# Patient Record
Sex: Male | Born: 1970 | Race: White | Hispanic: No | Marital: Single
Health system: Southern US, Community
[De-identification: ages and names within clinical notes are randomized; demographics above are authoritative.]

## PROBLEM LIST (undated history)

## (undated) DIAGNOSIS — F10231 Alcohol dependence with withdrawal delirium: Principal | ICD-10-CM

## (undated) DIAGNOSIS — I10 Essential (primary) hypertension: Secondary | ICD-10-CM

## (undated) DIAGNOSIS — F419 Anxiety disorder, unspecified: Secondary | ICD-10-CM

## (undated) DIAGNOSIS — D696 Thrombocytopenia, unspecified: Secondary | ICD-10-CM

## (undated) DIAGNOSIS — G4733 Obstructive sleep apnea (adult) (pediatric): Secondary | ICD-10-CM

## (undated) DIAGNOSIS — F1010 Alcohol abuse, uncomplicated: Secondary | ICD-10-CM

## (undated) DIAGNOSIS — R740 Nonspecific elevation of levels of transaminase and lactic acid dehydrogenase [LDH]: Secondary | ICD-10-CM

---
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
## 2005-11-02 ENCOUNTER — Emergency Department (HOSPITAL_COMMUNITY): Admission: EM | Admit: 2005-11-02 | Discharge: 2005-11-02 | Payer: Self-pay | Admitting: Emergency Medicine

## 2008-03-26 IMAGING — CR DG KNEE COMPLETE 4+V*R*
4 series · 4 of 4 positions shown · non-contrast
Comparison: none

CLINICAL DATA: Fall, right knee trauma and pain.
 RIGHT KNEE - 4 VIEW:
 There is no evidence of fracture, dislocation or joint effusion.  There is no evidence of arthropathy or other focal bone abnormality.  Soft tissues are unremarkable.

[t knee oblique right (1 of 2)]
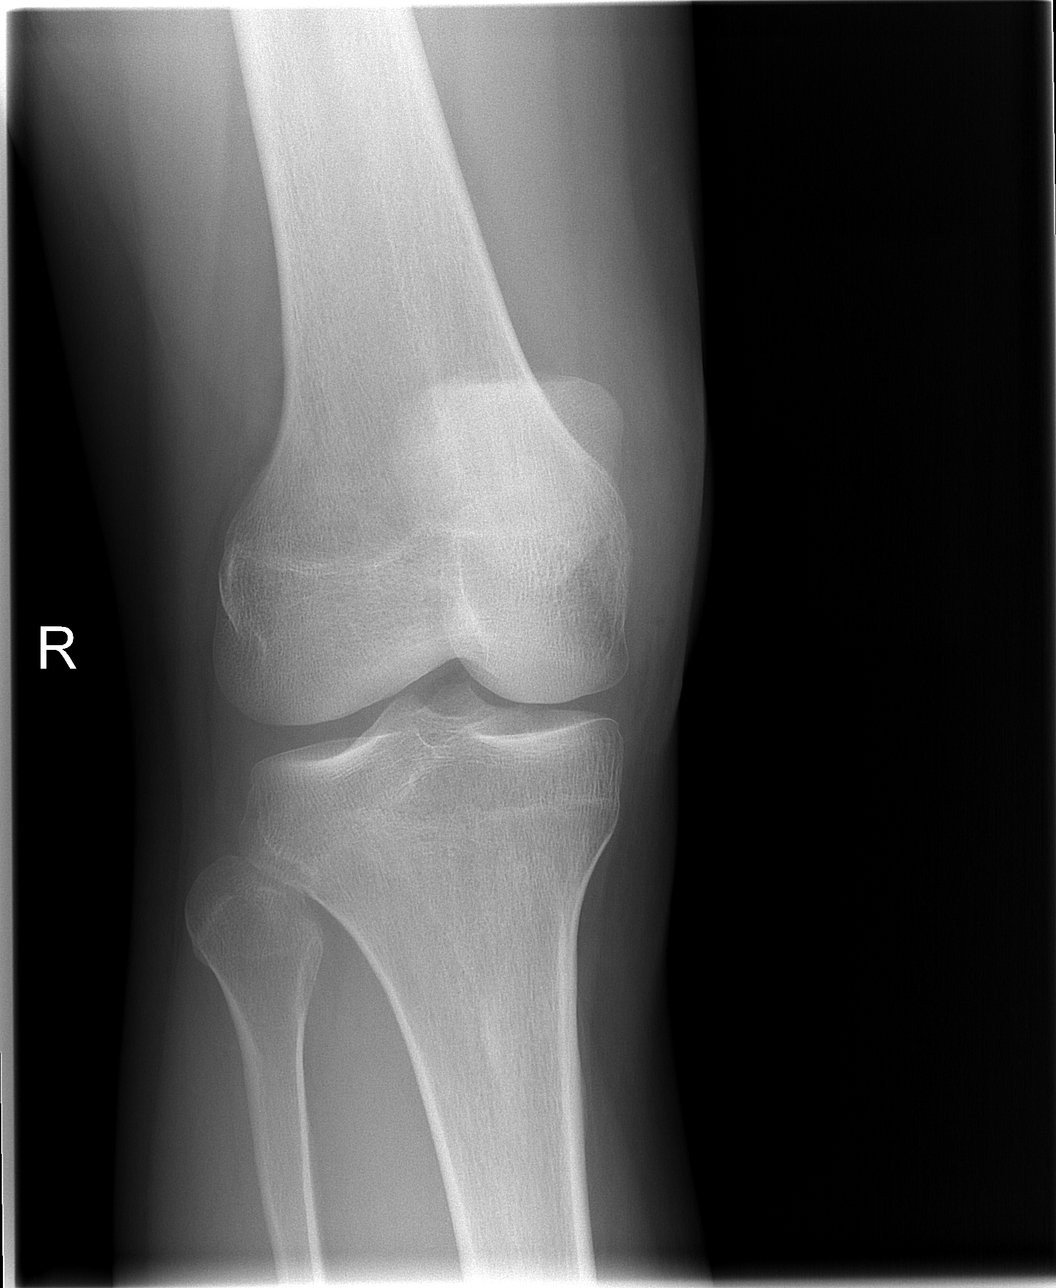

[t knee oblique right (2 of 2)]
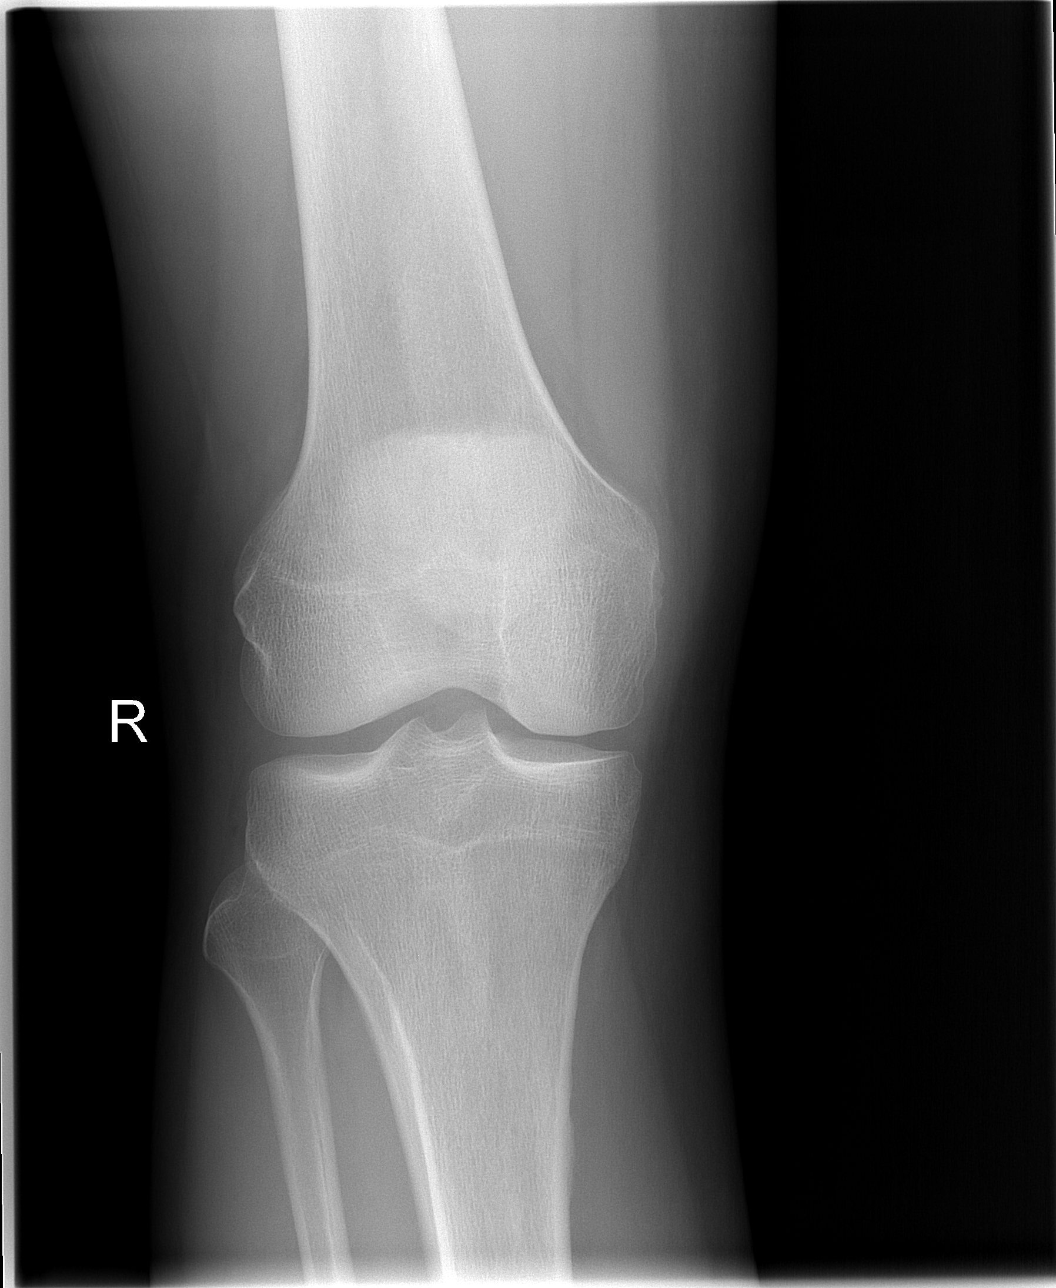

[t knee lat right]
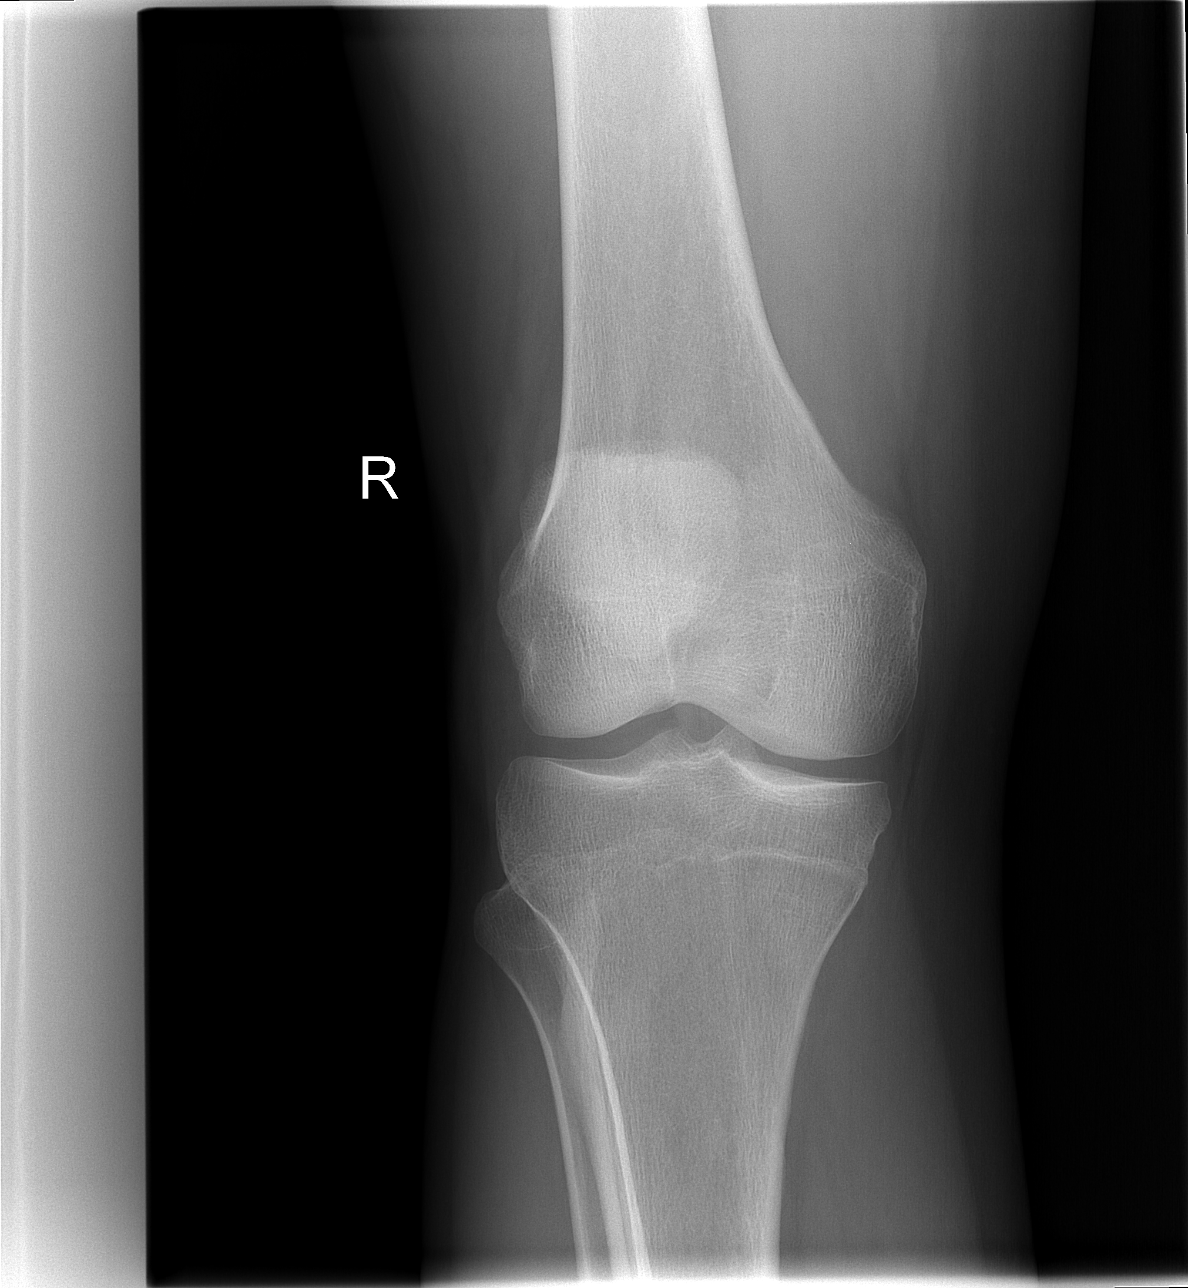

[t knee tunnel view right]
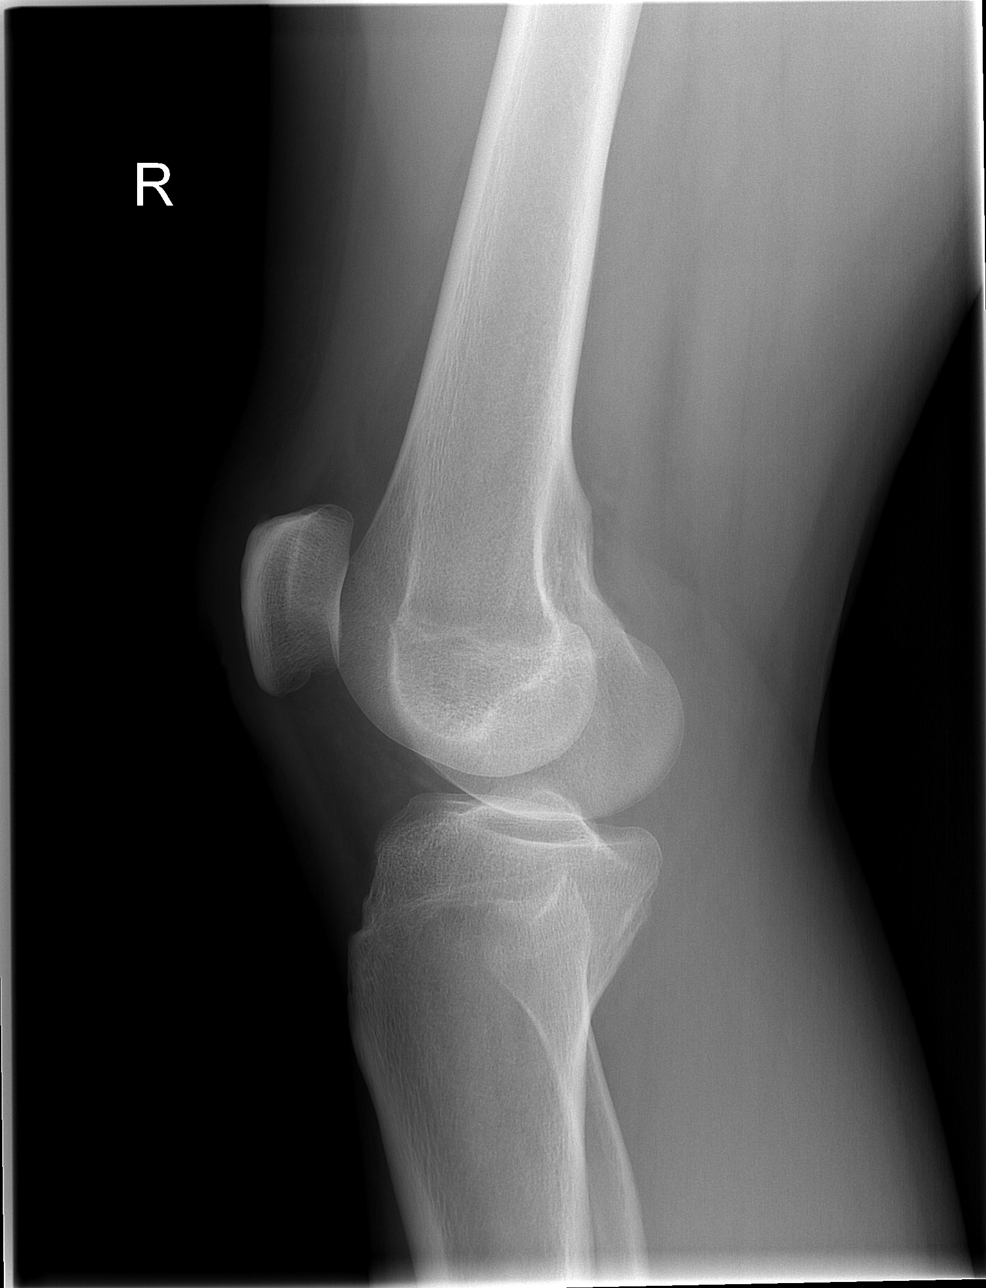

[4 of 4 positions shown; findings below may reference images not displayed]

IMPRESSION: Negative.

## 2011-06-12 ENCOUNTER — Encounter (HOSPITAL_COMMUNITY): Payer: Self-pay | Admitting: Emergency Medicine

## 2011-06-12 ENCOUNTER — Emergency Department (HOSPITAL_COMMUNITY): Payer: BC Managed Care – PPO

## 2011-06-12 ENCOUNTER — Inpatient Hospital Stay (HOSPITAL_COMMUNITY)
Admission: EM | Admit: 2011-06-12 | Discharge: 2011-06-16 | DRG: 750 | Disposition: A | Discharge status: Home / Self Care | Payer: BC Managed Care – PPO | Attending: Internal Medicine | Admitting: Internal Medicine

## 2011-06-12 DIAGNOSIS — R000 Tachycardia, unspecified: Secondary | ICD-10-CM | POA: Diagnosis present

## 2011-06-12 DIAGNOSIS — G4733 Obstructive sleep apnea (adult) (pediatric): Secondary | ICD-10-CM | POA: Diagnosis present

## 2011-06-12 DIAGNOSIS — R7401 Elevation of levels of liver transaminase levels: Secondary | ICD-10-CM | POA: Diagnosis present

## 2011-06-12 DIAGNOSIS — F1010 Alcohol abuse, uncomplicated: Secondary | ICD-10-CM | POA: Diagnosis present

## 2011-06-12 DIAGNOSIS — F411 Generalized anxiety disorder: Secondary | ICD-10-CM | POA: Diagnosis present

## 2011-06-12 DIAGNOSIS — H5316 Psychophysical visual disturbances: Secondary | ICD-10-CM | POA: Diagnosis present

## 2011-06-12 DIAGNOSIS — G9349 Other encephalopathy: Secondary | ICD-10-CM | POA: Diagnosis present

## 2011-06-12 DIAGNOSIS — F10931 Alcohol use, unspecified with withdrawal delirium: Principal | ICD-10-CM | POA: Diagnosis present

## 2011-06-12 DIAGNOSIS — E872 Acidosis, unspecified: Secondary | ICD-10-CM | POA: Diagnosis present

## 2011-06-12 DIAGNOSIS — F10231 Alcohol dependence with withdrawal delirium: Principal | ICD-10-CM

## 2011-06-12 DIAGNOSIS — J9600 Acute respiratory failure, unspecified whether with hypoxia or hypercapnia: Secondary | ICD-10-CM | POA: Diagnosis not present

## 2011-06-12 DIAGNOSIS — G9340 Encephalopathy, unspecified: Secondary | ICD-10-CM | POA: Diagnosis present

## 2011-06-12 DIAGNOSIS — R410 Disorientation, unspecified: Secondary | ICD-10-CM

## 2011-06-12 DIAGNOSIS — R4182 Altered mental status, unspecified: Secondary | ICD-10-CM | POA: Diagnosis not present

## 2011-06-12 DIAGNOSIS — R7402 Elevation of levels of lactic acid dehydrogenase (LDH): Secondary | ICD-10-CM | POA: Diagnosis present

## 2011-06-12 DIAGNOSIS — F419 Anxiety disorder, unspecified: Secondary | ICD-10-CM | POA: Diagnosis present

## 2011-06-12 DIAGNOSIS — D696 Thrombocytopenia, unspecified: Secondary | ICD-10-CM | POA: Diagnosis present

## 2011-06-12 DIAGNOSIS — E860 Dehydration: Secondary | ICD-10-CM | POA: Diagnosis present

## 2011-06-12 DIAGNOSIS — E876 Hypokalemia: Secondary | ICD-10-CM | POA: Diagnosis present

## 2011-06-12 DIAGNOSIS — I10 Essential (primary) hypertension: Secondary | ICD-10-CM | POA: Diagnosis present

## 2011-06-12 DIAGNOSIS — J9811 Atelectasis: Secondary | ICD-10-CM | POA: Diagnosis not present

## 2011-06-12 HISTORY — DX: Nonspecific elevation of levels of transaminase and lactic acid dehydrogenase (ldh): R74.0

## 2011-06-12 HISTORY — DX: Thrombocytopenia, unspecified: D69.6

## 2011-06-12 HISTORY — DX: Alcohol dependence with withdrawal delirium: F10.231

## 2011-06-12 HISTORY — DX: Alcohol abuse, uncomplicated: F10.10

## 2011-06-12 HISTORY — DX: Obstructive sleep apnea (adult) (pediatric): G47.33

## 2011-06-12 HISTORY — DX: Essential (primary) hypertension: I10

## 2011-06-12 HISTORY — DX: Anxiety disorder, unspecified: F41.9

## 2011-06-12 LAB — POCT I-STAT, CHEM 8
BUN: 40 mg/dL — ABNORMAL HIGH (ref 6–23)
Chloride: 104 mEq/L (ref 96–112)
Creatinine, Ser: 1.4 mg/dL — ABNORMAL HIGH (ref 0.50–1.35)
Potassium: 3.8 mEq/L (ref 3.5–5.1)
Sodium: 135 mEq/L (ref 135–145)

## 2011-06-12 LAB — ETHANOL: Alcohol, Ethyl (B): <11 mg/dL (ref 0–11)

## 2011-06-12 LAB — RAPID URINE DRUG SCREEN, HOSP PERFORMED: Barbiturates: NOT DETECTED

## 2011-06-12 MED ORDER — ACETAMINOPHEN 325 MG PO TABS
650.0000 mg | ORAL_TABLET | ORAL | Status: DC | PRN
  Filled 2011-06-12: qty 1

## 2011-06-12 MED ORDER — LORAZEPAM 1 MG PO TABS
1.0000 mg | ORAL_TABLET | Freq: Once | ORAL | Status: AC
  Administered 2011-06-12: 1 mg via ORAL
  Filled 2011-06-12: qty 1

## 2011-06-12 MED ORDER — NICOTINE 21 MG/24HR TD PT24
21.0000 mg | MEDICATED_PATCH | Freq: Once | TRANSDERMAL | Status: AC
  Administered 2011-06-12: 21 mg via TRANSDERMAL
  Filled 2011-06-12: qty 1

## 2011-06-12 MED ORDER — LORAZEPAM 2 MG/ML IJ SOLN: 2.0000 mg | Freq: Once | INTRAMUSCULAR | Status: DC

## 2011-06-12 MED ORDER — SODIUM CHLORIDE 0.9 % IV BOLUS (SEPSIS): 500.0000 mL | Freq: Once | INTRAVENOUS | Status: DC

## 2011-06-12 MED ORDER — LORAZEPAM 2 MG/ML IJ SOLN
1.0000 mg | Freq: Once | INTRAMUSCULAR | Status: DC
  Filled 2011-06-12: qty 1

## 2011-06-12 MED ORDER — ZIPRASIDONE MESYLATE 20 MG IM SOLR
10.0000 mg | Freq: Once | INTRAMUSCULAR | Status: AC
  Administered 2011-06-12: 10 mg via INTRAMUSCULAR
  Filled 2011-06-12: qty 20

## 2011-06-12 MED ORDER — ALUM & MAG HYDROXIDE-SIMETH 200-200-20 MG/5ML PO SUSP: 30.0000 mL | ORAL | Status: DC | PRN

## 2011-06-12 MED ORDER — ONDANSETRON HCL 4 MG PO TABS: 4.0000 mg | ORAL_TABLET | Freq: Three times a day (TID) | ORAL | Status: DC | PRN

## 2011-06-12 NOTE — ED Notes (Signed)
Pt presenting to ed with c/o possible medication reaction pt states he's been on medication for 3 days pt states medication is affecting his professional and personal life. Pt states he was unable to see pcp.

## 2011-06-12 NOTE — ED Provider Notes (Signed)
History     CSN: [id number]  Arrival date & time 06/12/11  1255   First MD Initiated Contact with Patient 06/12/11 1437      Chief Complaint  Patient presents with  . Medication Reaction    (Consider location/radiation/quality/duration/timing/severity/associated sxs/prior treatment) The history is provided by the patient and a parent.  41 y/o M with PMH of anxiety, HTN, and admitted alcohol abuse with at least 4 shots of liquor/4 beers each day (until 1.5 weeks ago, when he reports he stopped drinking) presents to ED with c/c of medication reaction, anxiety, and anger issues. Was seen by his PCP, Dr Larwance Sachs 2 days ago for anxiety and anger issues with new rx started of propanolol and clonazepam. Labs to include CBC, CMP, thyroid studies were also drawn but pt does not known the results. Pt reports that after starting the medications, he began to have visual hallucinations, especially at night while trying to sleep. He describes difficulty sleeping for several months but denies he has ever had associated hallucinations in the past. Denies any SI or HI. Denies any recreational substance use. Per mother, called PCP today to discuss hallucinations and was advised to present to ED for evaluation. Pt denies any hallucinations today; has not taken the propanolol or clonazepam today. He requests a change in medication, "something to help with me getting so anxious and angry all the time", as he feels his behavior is affecting multiple aspects of his life. Denies any pain.  Past Medical History  Diagnosis Date  . Anxiety   . Hypertension     History reviewed. No pertinent past surgical history.  No family history on file.  History  Substance Use Topics  . Smoking status: Current Everyday Smoker  . Smokeless tobacco: Not on file  . Alcohol Use: 1.8 oz/week    3 Cans of beer per week     daily      Review of Systems  Constitutional: Negative for fever and chills.  HENT: Negative for ear  pain, sore throat and neck pain.   Eyes: Positive for visual disturbance. Negative for pain.  Respiratory: Negative for cough and shortness of breath.   Cardiovascular: Negative for chest pain.  Gastrointestinal: Negative for nausea, vomiting and abdominal pain.  Genitourinary: Negative for dysuria and hematuria.  Musculoskeletal: Negative for back pain and gait problem.  Skin: Negative for rash and wound.  Neurological: Positive for tremors. Negative for dizziness, seizures, speech difficulty, weakness and headaches.  Psychiatric/Behavioral:       See HPI, otherwise negative    Allergies  Review of patient's allergies indicates no known allergies.  Home Medications   Current Outpatient Rx  Name Route Sig Dispense Refill  . CLONAZEPAM 0.5 MG PO TABS Oral Take 0.5 mg by mouth 2 (two) times daily as needed. ANXIETY    . PROPRANOLOL HCL 20 MG PO TABS Oral Take 20 mg by mouth 2 (two) times daily.      BP 118/75  Pulse 109  Resp 20  SpO2 97%  Physical Exam  Nursing note and vitals reviewed. Constitutional: He is oriented to person, place, and time. He appears well-developed and well-nourished.       Anxious appearing, in no distress  HENT:  Head: Normocephalic and atraumatic.  Right Ear: External ear normal.  Left Ear: External ear normal.  Eyes: Conjunctivae are normal. Pupils are equal, round, and reactive to light. No scleral icterus.  Neck: Normal range of motion. Neck supple.  Cardiovascular: Regular rhythm,  normal heart sounds and normal pulses.  Tachycardia present.   Pulmonary/Chest: Effort normal and breath sounds normal. No respiratory distress. He exhibits no tenderness.  Abdominal: Soft. He exhibits no distension. There is no tenderness.  Musculoskeletal: He exhibits no edema and no tenderness.  Neurological: He is alert and oriented to person, place, and time. He has normal strength. He displays tremor. No cranial nerve deficit. He displays no seizure activity. Gait  normal. GCS eye subscore is 4. GCS verbal subscore is 5. GCS motor subscore is 6.  Skin: Skin is warm and dry.  Psychiatric: His mood appears anxious. His speech is rapid and/or pressured. He is agitated. He is not actively hallucinating. Thought content is not paranoid and not delusional. He expresses impulsivity. He expresses no homicidal and no suicidal ideation.    ED Course  Procedures (including critical care time)  Labs Reviewed  POCT I-STAT, CHEM 8 - Abnormal; Notable for the following:    BUN 40 (*)    Creatinine, Ser 1.40 (*)    Hemoglobin 17.3 (*)    All other components within normal limits  ETHANOL  URINE RAPID DRUG SCREEN (HOSP PERFORMED)    MDM  Pt requesting medication change. Recent alcohol abuse, though reported timeframe of cessation puts pt out of window for necessary medical detox. However, his mother is concerned he is not truthful regarding his alcohol intake and feels he may have been drinking more recently. Basic labs are ordered. Does not appear to be actively hallucinating. Per discussion with attending MD, tele-psych consult is ordered to eval pt medications and for recommendations going forward. Will also plan to refer patient to outpatient psychiatrist for medication management, should tele-psych recommend discharge.  Report has been given to [facility], who will follow-up with recommendations. Psych holding orders will be placed.       Shaaron Adler, [place] 06/12/11 1549

## 2011-06-12 NOTE — ED Provider Notes (Signed)
Medical screening examination/treatment/procedure(s) were performed by non-physician practitioner and as supervising physician I was immediately available for consultation/collaboration.   Lyanne Co, MD 06/12/11 [phone]

## 2011-06-12 NOTE — ED Notes (Signed)
Telepsych called and states MD will be calling soon for consult

## 2011-06-12 NOTE — ED Notes (Signed)
Mother to desk stating pt is an alcoholic and is concerned this may have something to do with this

## 2011-06-12 NOTE — ED Notes (Signed)
Pt states he recently started anxiety and blood pressure meds

## 2011-06-12 NOTE — ED Notes (Signed)
Pt in cuffs [employer] and security at bedside for safety. Pt stated, "how many 7year olds can I fuck". Pt also stated," I need to get to my star ship"

## 2011-06-12 NOTE — ED Provider Notes (Cosign Needed Addendum)
Signed out by Dr Patria Mane that telepsych evaluation pending. While in ED pt w periods restless, agitation, hallucinations. Pt was given ativan, provided reassurance.  Calmer.  Return of agitation, ?delusional.  geodon im.  telepsych still pending.  No sweats, no tremor or shakes. Vitals noted. Will recheck.    Psychiatry consult - telepsych md expresses concern re organic delirium possibly the result of etoh withdrawal. ?masking vitals/symptoms due to recent rx for propranolol.  pts med/psych hx unclear, also unclear when last etoh use. As persistent delirium, possible etoh withdrawal, will admit to med service.   etoh withdrawal/ativan ciwa protocol initiated.     Date: 06/13/2011  Rate: 110  Rhythm: sinus tachycardia  QRS Axis: normal  Intervals: normal  ST/T Wave abnormalities: nonspecific ST/T changes  Conduction Disutrbances:none  Narrative Interpretation:   Old EKG Reviewed: none available    Suzi Roots, MD 06/12/11 2138  Suzi Roots, MD 06/12/11 4098  Suzi Roots, MD 06/13/11 0003

## 2011-06-13 ENCOUNTER — Encounter (HOSPITAL_COMMUNITY): Payer: Self-pay | Admitting: Internal Medicine

## 2011-06-13 DIAGNOSIS — J9811 Atelectasis: Secondary | ICD-10-CM | POA: Diagnosis not present

## 2011-06-13 DIAGNOSIS — F10931 Alcohol use, unspecified with withdrawal delirium: Principal | ICD-10-CM

## 2011-06-13 DIAGNOSIS — R4182 Altered mental status, unspecified: Secondary | ICD-10-CM | POA: Diagnosis not present

## 2011-06-13 DIAGNOSIS — F10231 Alcohol dependence with withdrawal delirium: Principal | ICD-10-CM

## 2011-06-13 DIAGNOSIS — F419 Anxiety disorder, unspecified: Secondary | ICD-10-CM | POA: Diagnosis present

## 2011-06-13 DIAGNOSIS — R000 Tachycardia, unspecified: Secondary | ICD-10-CM | POA: Diagnosis present

## 2011-06-13 DIAGNOSIS — J9819 Other pulmonary collapse: Secondary | ICD-10-CM

## 2011-06-13 DIAGNOSIS — I10 Essential (primary) hypertension: Secondary | ICD-10-CM

## 2011-06-13 DIAGNOSIS — D696 Thrombocytopenia, unspecified: Secondary | ICD-10-CM

## 2011-06-13 DIAGNOSIS — E876 Hypokalemia: Secondary | ICD-10-CM | POA: Diagnosis present

## 2011-06-13 DIAGNOSIS — G4733 Obstructive sleep apnea (adult) (pediatric): Secondary | ICD-10-CM

## 2011-06-13 DIAGNOSIS — E872 Acidosis: Secondary | ICD-10-CM | POA: Diagnosis present

## 2011-06-13 DIAGNOSIS — F1010 Alcohol abuse, uncomplicated: Secondary | ICD-10-CM

## 2011-06-13 DIAGNOSIS — E860 Dehydration: Secondary | ICD-10-CM | POA: Diagnosis present

## 2011-06-13 DIAGNOSIS — G9340 Encephalopathy, unspecified: Secondary | ICD-10-CM | POA: Diagnosis present

## 2011-06-13 DIAGNOSIS — J9600 Acute respiratory failure, unspecified whether with hypoxia or hypercapnia: Secondary | ICD-10-CM

## 2011-06-13 HISTORY — DX: Alcohol abuse, uncomplicated: F10.10

## 2011-06-13 HISTORY — DX: Obstructive sleep apnea (adult) (pediatric): G47.33

## 2011-06-13 HISTORY — DX: Alcohol dependence with withdrawal delirium: F10.231

## 2011-06-13 HISTORY — DX: Essential (primary) hypertension: I10

## 2011-06-13 HISTORY — DX: Alcohol use, unspecified with withdrawal delirium: F10.931

## 2011-06-13 HISTORY — DX: Thrombocytopenia, unspecified: D69.6

## 2011-06-13 LAB — HEPATIC FUNCTION PANEL
AST: 68 U/L — ABNORMAL HIGH (ref 0–37)
Bilirubin, Direct: 0.2 mg/dL (ref 0.0–0.3)
Indirect Bilirubin: 0.7 mg/dL (ref 0.3–0.9)
Total Protein: 6.4 g/dL (ref 6.0–8.3)

## 2011-06-13 LAB — BASIC METABOLIC PANEL
BUN: 32 mg/dL — ABNORMAL HIGH (ref 6–23)
GFR calc Af Amer: >90 mL/min (ref >90)
GFR calc non Af Amer: >90 mL/min (ref >90)
Potassium: 3.1 mEq/L — ABNORMAL LOW (ref 3.5–5.1)

## 2011-06-13 LAB — CBC
Hemoglobin: 13.5 g/dL (ref 13.0–17.0)
MCHC: 34.8 g/dL (ref 30.0–36.0)
RDW: 15 % (ref 11.5–15.5)
WBC: 8.3 10*3/uL (ref 4.0–10.5)

## 2011-06-13 LAB — MAGNESIUM: Magnesium: 1.6 mg/dL (ref 1.5–2.5)

## 2011-06-13 LAB — MRSA PCR SCREENING: MRSA by PCR: NEGATIVE

## 2011-06-13 LAB — HAPTOGLOBIN: Haptoglobin: 91 mg/dL (ref 45–215)

## 2011-06-13 MED ORDER — ENOXAPARIN SODIUM 40 MG/0.4ML ~~LOC~~ SOLN
40.0000 mg | SUBCUTANEOUS | Status: DC
  Filled 2011-06-13: qty 0.4

## 2011-06-13 MED ORDER — ALUM & MAG HYDROXIDE-SIMETH 200-200-20 MG/5ML PO SUSP: 30.0000 mL | Freq: Four times a day (QID) | ORAL | Status: DC | PRN

## 2011-06-13 MED ORDER — LORAZEPAM 2 MG/ML IJ SOLN: 0.0000 mg | Freq: Four times a day (QID) | INTRAMUSCULAR | Status: DC

## 2011-06-13 MED ORDER — HYDROMORPHONE HCL PF 1 MG/ML IJ SOLN: 0.5000 mg | INTRAMUSCULAR | Status: DC | PRN

## 2011-06-13 MED ORDER — LORAZEPAM 2 MG/ML IJ SOLN
1.0000 mg | Freq: Four times a day (QID) | INTRAMUSCULAR | Status: AC | PRN
  Administered 2011-06-13: 1 mg via INTRAVENOUS
  Filled 2011-06-13 (×5): qty 1

## 2011-06-13 MED ORDER — HALOPERIDOL LACTATE 5 MG/ML IJ SOLN
INTRAMUSCULAR | Status: AC
  Filled 2011-06-13: qty 1

## 2011-06-13 MED ORDER — LORAZEPAM 2 MG/ML IJ SOLN
0.0000 mg | Freq: Two times a day (BID) | INTRAMUSCULAR | Status: DC
  Administered 2011-06-15: 2 mg via INTRAVENOUS
  Administered 2011-06-16: 1 mg via INTRAVENOUS
  Filled 2011-06-13: qty 2

## 2011-06-13 MED ORDER — ACETAMINOPHEN 650 MG RE SUPP: 650.0000 mg | Freq: Four times a day (QID) | RECTAL | Status: DC | PRN

## 2011-06-13 MED ORDER — HALOPERIDOL LACTATE 5 MG/ML IJ SOLN
5.0000 mg | Freq: Four times a day (QID) | INTRAMUSCULAR | Status: DC | PRN
  Administered 2011-06-13: 5 mg via INTRAVENOUS

## 2011-06-13 MED ORDER — LORAZEPAM 2 MG/ML IJ SOLN: 0.0000 mg | Freq: Two times a day (BID) | INTRAMUSCULAR | Status: DC

## 2011-06-13 MED ORDER — ADULT MULTIVITAMIN W/MINERALS CH
1.0000 | ORAL_TABLET | Freq: Every day | ORAL | Status: DC
  Filled 2011-06-13: qty 1

## 2011-06-13 MED ORDER — LORAZEPAM 2 MG/ML IJ SOLN: 1.0000 mg | Freq: Four times a day (QID) | INTRAMUSCULAR | Status: DC | PRN

## 2011-06-13 MED ORDER — SODIUM CHLORIDE 0.9 % IV SOLN
0.4000 ug/kg/h | INTRAVENOUS | Status: DC
  Administered 2011-06-13: 0.4 ug/kg/h via INTRAVENOUS
  Filled 2011-06-13: qty 2

## 2011-06-13 MED ORDER — ZOLPIDEM TARTRATE 5 MG PO TABS: 5.0000 mg | ORAL_TABLET | Freq: Every evening | ORAL | Status: DC | PRN

## 2011-06-13 MED ORDER — THIAMINE HCL 100 MG/ML IJ SOLN: 100.0000 mg | Freq: Every day | INTRAMUSCULAR | Status: DC

## 2011-06-13 MED ORDER — SODIUM CHLORIDE 0.9 % IV BOLUS (SEPSIS)
1000.0000 mL | Freq: Once | INTRAVENOUS | Status: AC
  Administered 2011-06-13: 1000 mL via INTRAVENOUS

## 2011-06-13 MED ORDER — THIAMINE HCL 100 MG/ML IJ SOLN
100.0000 mg | Freq: Every day | INTRAMUSCULAR | Status: DC
  Administered 2011-06-13 – 2011-06-15 (×2): 100 mg via INTRAVENOUS
  Filled 2011-06-13 (×4): qty 1

## 2011-06-13 MED ORDER — ACETAMINOPHEN 325 MG PO TABS: 650.0000 mg | ORAL_TABLET | Freq: Four times a day (QID) | ORAL | Status: DC | PRN

## 2011-06-13 MED ORDER — POTASSIUM CHLORIDE CRYS ER 20 MEQ PO TBCR: 40.0000 meq | EXTENDED_RELEASE_TABLET | Freq: Once | ORAL | Status: DC

## 2011-06-13 MED ORDER — VITAMIN B-1 100 MG PO TABS
100.0000 mg | ORAL_TABLET | Freq: Every day | ORAL | Status: DC
  Administered 2011-06-14 – 2011-06-16 (×2): 100 mg via ORAL
  Filled 2011-06-13 (×4): qty 1

## 2011-06-13 MED ORDER — FOLIC ACID 1 MG PO TABS: 1.0000 mg | ORAL_TABLET | Freq: Every day | ORAL | Status: DC

## 2011-06-13 MED ORDER — LORAZEPAM 1 MG PO TABS: 1.0000 mg | ORAL_TABLET | Freq: Four times a day (QID) | ORAL | Status: DC | PRN

## 2011-06-13 MED ORDER — LORAZEPAM 2 MG/ML IJ SOLN
2.0000 mg | Freq: Once | INTRAMUSCULAR | Status: AC
  Administered 2011-06-13: 2 mg via INTRAMUSCULAR

## 2011-06-13 MED ORDER — ONDANSETRON HCL 4 MG/2ML IJ SOLN: 4.0000 mg | Freq: Four times a day (QID) | INTRAMUSCULAR | Status: DC | PRN

## 2011-06-13 MED ORDER — LORAZEPAM 1 MG PO TABS
1.0000 mg | ORAL_TABLET | Freq: Four times a day (QID) | ORAL | Status: AC | PRN
  Administered 2011-06-15: 1 mg via ORAL
  Filled 2011-06-13: qty 1

## 2011-06-13 MED ORDER — OXYCODONE HCL 5 MG PO TABS: 5.0000 mg | ORAL_TABLET | ORAL | Status: DC | PRN

## 2011-06-13 MED ORDER — PANTOPRAZOLE SODIUM 40 MG IV SOLR
40.0000 mg | INTRAVENOUS | Status: DC
  Administered 2011-06-13: 40 mg via INTRAVENOUS
  Filled 2011-06-13 (×2): qty 40

## 2011-06-13 MED ORDER — POTASSIUM CHLORIDE 10 MEQ/100ML IV SOLN
10.0000 meq | INTRAVENOUS | Status: AC
  Administered 2011-06-13 (×4): 10 meq via INTRAVENOUS
  Filled 2011-06-13: qty 100
  Filled 2011-06-13: qty 200
  Filled 2011-06-13: qty 100

## 2011-06-13 MED ORDER — ONDANSETRON HCL 4 MG PO TABS: 4.0000 mg | ORAL_TABLET | Freq: Four times a day (QID) | ORAL | Status: DC | PRN

## 2011-06-13 MED ORDER — STERILE WATER FOR INJECTION IV SOLN
INTRAVENOUS | Status: DC
  Administered 2011-06-13 – 2011-06-14 (×3): via INTRAVENOUS
  Filled 2011-06-13 (×6): qty 850

## 2011-06-13 MED ORDER — FOLIC ACID 1 MG PO TABS
1.0000 mg | ORAL_TABLET | Freq: Every day | ORAL | Status: DC
  Filled 2011-06-13: qty 1

## 2011-06-13 MED ORDER — THIAMINE HCL 100 MG/ML IJ SOLN
Freq: Once | INTRAVENOUS | Status: AC
  Administered 2011-06-13: 03:00:00 via INTRAVENOUS
  Filled 2011-06-13: qty 1000

## 2011-06-13 MED ORDER — LORAZEPAM 2 MG/ML IJ SOLN
0.0000 mg | Freq: Four times a day (QID) | INTRAMUSCULAR | Status: AC
  Administered 2011-06-13: 1 mg via INTRAVENOUS
  Administered 2011-06-13: 4 mg via INTRAVENOUS
  Administered 2011-06-14 (×4): 1 mg via INTRAVENOUS
  Filled 2011-06-13 (×4): qty 1

## 2011-06-13 MED ORDER — FOLIC ACID 5 MG/ML IJ SOLN
1.0000 mg | Freq: Every day | INTRAMUSCULAR | Status: DC
  Administered 2011-06-13 – 2011-06-15 (×3): 1 mg via INTRAVENOUS
  Filled 2011-06-13 (×4): qty 0.2

## 2011-06-13 MED ORDER — MAGNESIUM SULFATE 50 % IJ SOLN
3.0000 g | Freq: Once | INTRAVENOUS | Status: AC
  Administered 2011-06-13: 3 g via INTRAVENOUS
  Filled 2011-06-13: qty 6

## 2011-06-13 MED ORDER — SODIUM CHLORIDE 0.9 % IV SOLN
0.4000 ug/kg/h | INTRAVENOUS | Status: DC
  Administered 2011-06-13: 1 ug/kg/h via INTRAVENOUS
  Administered 2011-06-13: 0.9 ug/kg/h via INTRAVENOUS
  Administered 2011-06-13: 1 ug/kg/h via INTRAVENOUS
  Administered 2011-06-13: 0.8 ug/kg/h via INTRAVENOUS
  Administered 2011-06-14: 0.2 ug/kg/h via INTRAVENOUS
  Filled 2011-06-13 (×6): qty 4

## 2011-06-13 MED ORDER — VITAMIN B-1 100 MG PO TABS: 100.0000 mg | ORAL_TABLET | Freq: Every day | ORAL | Status: DC

## 2011-06-13 MED ORDER — ADULT MULTIVITAMIN W/MINERALS CH: 1.0000 | ORAL_TABLET | Freq: Every day | ORAL | Status: DC

## 2011-06-13 NOTE — Progress Notes (Signed)
Patient wakes up intermittently with restlessness and agitation. Attempts to pull his lines and tubes and actually pulled out his nasal trumpet from his left nare. He says " Get me out of this fucking place. I need a lawyer." He asked if something was wrong with his Dad. Patient's parents visiting at this time. They say he has been hallucinating. Continuing to reorient patient and give him all care and support as needed.

## 2011-06-13 NOTE — ED Notes (Addendum)
Pt is uncooperative and combative. Pt stated  about CNA,"  Don't leave me with her, I can tell she is tight by the EKG. She will ride me if you leave her here. I need recuperating time." Pt proceeded to urinate on floor after being offered a urinal. Pt is refusing to have a IV.

## 2011-06-13 NOTE — Consult Note (Signed)
Patient name: Warren Aguilar Medical record number: [id number] Date of birth: [birth year] Age: 41 y.o. Gender: male PCP: Berenda Morale, MD, MD  PCCM CONSULT NOTE  Date: 06/13/2011 Reason for Consult: Alcohol withdrawal, severe agitation Referring Physician: Dr. Lovell Sheehan  History of Present Illness: Warren Aguilar is a 41 year old gentleman with a past medical history significant for self-reported alcohol abuse who presented to the emergency department today with chief complaint of hallucinations and agitation. He reports he stopped drinking recently. His primary care had put him on propranolol and Klonopin recently. Prior this he was drinking at least 4 alcoholic beverages a day. He was admitted and started on CIWA protocol. Despite this he has been increasingly agitated. We are consulted for further evaluation of his agitation and the need for ICU care.  Lines/tubes :   Microbiology/Sepsis markers: No results found for this or any previous visit.  Anti-infectives:  Anti-infectives    None      Protocols:  Precedex  Consults: Treatment Team:  Md Pccm, MD    Studies: Ct Head Wo Contrast  06/12/2011  *RADIOLOGY REPORT*  Clinical Data: 41 year old male with hallucinations and visual disturbance  CT HEAD WITHOUT CONTRAST  Technique:  Contiguous axial images were obtained from the base of the skull through the vertex without contrast.  Comparison: None  Findings: No intracranial abnormalities are identified, including mass lesion or mass effect, hydrocephalus, extra-axial fluid collection, midline shift, hemorrhage, or acute infarction.  The visualized bony calvarium is unremarkable.  IMPRESSION: Unremarkable noncontrast head CT  Original Report Authenticated By: Rosendo Gros, M.D.     Events:   Past Medical History  Diagnosis Date  . Anxiety   . Hypertension     History reviewed. No pertinent past surgical history.  No family history on file.  Social History:  reports that he has  been smoking.  He does not have any smokeless tobacco history on file. He reports that he drinks about 1.8 ounces of alcohol per week. He reports that he does not use illicit drugs.  Allergies: No Known Allergies  Medications:  Prior to Admission medications   Medication Sig Start Date End Date Taking? Authorizing Provider  clonazePAM (KLONOPIN) 0.5 MG tablet Take 0.5 mg by mouth 2 (two) times daily as needed. ANXIETY   Yes Historical Provider, MD  propranolol (INDERAL) 20 MG tablet Take 20 mg by mouth 2 (two) times daily.   Yes Historical Provider, MD    Review of systems not obtained due to patient factors.  Vital signs for last 24 hours: Temp:  [97.4 F (36.3 C)-99 F (37.2 C)] 99 F (37.2 C) (04/13 0038) Pulse Rate:  [92-139] 99  (04/13 0230) Resp:  [16-36] 19  (04/13 0230) BP: (115-187)/(75-105) 151/84 mmHg (04/13 0230) SpO2:  [93 %-97 %] 94 % (04/13 0230) Weight:  [88.6 kg (195 lb 5.2 oz)] 88.6 kg (195 lb 5.2 oz) (04/13 0152)  Intake/Output this shift: Total I/O In: 1000 [IV Piggyback:1000] Out: -   Vent settings for last 24 hours:    Physical Exam:  General: Agitated, not alert or interactive. Eyes: Anicteric sclerae. ENT: Oropharynx clear. Moist mucous membranes. No thrush Lymph: No cervical, supraclavicular, or axillary lymphadenopathy. Heart: Normal S1, S2. No murmurs, rubs, or gallops appreciated. No bruits, equal pulses. Lungs: Breath sounds are equal, however definitely obstructs in the supine position.. Abdomen: Abdomen soft, non-tender and not distended, normoactive bowel sounds. No hepatosplenomegaly or masses. Musculoskeletal: No clubbing or synovitis. Skin: No rashes or lesions Neuro: No focal neurologic  deficits.  LAB RESULT Results for orders placed during the hospital encounter of 06/12/11 (from the past 24 hour(s))  ETHANOL     Status: Normal   Collection Time   06/12/11  3:07 PM      Component Value Range   Alcohol, Ethyl (B) <11  0 - 11 (mg/dL)   POCT I-STAT, CHEM 8     Status: Abnormal   Collection Time   06/12/11  3:15 PM      Component Value Range   Sodium 135  135 - 145 (mEq/L)   Potassium 3.8  3.5 - 5.1 (mEq/L)   Chloride 104  96 - 112 (mEq/L)   BUN 40 (*) 6 - 23 (mg/dL)   Creatinine, Ser 1.61 (*) 0.50 - 1.35 (mg/dL)   Glucose, Bld 80  70 - 99 (mg/dL)   Calcium, Ion 0.96  0.45 - 1.32 (mmol/L)   TCO2 17  0 - 100 (mmol/L)   Hemoglobin 17.3 (*) 13.0 - 17.0 (g/dL)   HCT 40.9  81.1 - 91.4 (%)  URINE RAPID DRUG SCREEN (HOSP PERFORMED)     Status: Normal   Collection Time   06/12/11  3:46 PM      Component Value Range   Opiates NONE DETECTED  NONE DETECTED    Cocaine NONE DETECTED  NONE DETECTED    Benzodiazepines NONE DETECTED  NONE DETECTED    Amphetamines NONE DETECTED  NONE DETECTED    Tetrahydrocannabinol NONE DETECTED  NONE DETECTED    Barbiturates NONE DETECTED  NONE DETECTED       ASSESSMENT AND PLAN  RESPIRATORY No results found for this basename: PHART:5,PCO2:5,PCO2ART:5,PO2ART:5,HCO3:5,O2SAT:5 in the last 168 hours A:  Probable obstructive sleep apnea P:  I placed a nasal trumpet, and this resulted in improvement in his airway.  CARDIAC No results found for this basename: TROPONINI:5,PROBNP:5 in the last 168 hours A:  Tachycardia P:   This is most likely a symptom of his alcohol withdrawal. It did come down nicely with the Precedex.  NEUROLOGIC A:  Alcohol withdrawal, altered mental status P:  Head CT was negative, he will continue on Precedex and Haldol on a taper in order to regain consciousness.    LOS: 1 day   Best practices / Disposition: -->ICU status under [facility] -->Full Code -->Heparin for DVT Px -->Protonix for GI Px -->ventilator bundle -->diet -->family updated at bedside  Lavella Hammock, M.D. Pulmonary and Critical Care Medicine Call E-link with questions [phone]  06/13/2011

## 2011-06-13 NOTE — H&P (Signed)
DATE OF ADMISSION:  06/13/2011  PCP:    Berenda Morale, MD, MD   Chief Complaint:  Confusion, Hallucinations   HPI: Warren Aguilar is an 41 y.o. male with a history of alcohol abuse who was brought to the ED secondary to visual hallucinations, extreme agitation .  His parents report that he has not had a drink in 2 days.    The patient reports that he was taking ativan for  Withdrawal symptoms the past 2 days.    Past Medical History  Diagnosis Date  . Anxiety   . Hypertension   . Alcohol withdrawal delirium 06/13/2011  . Thrombocytopenia 06/13/2011  . HTN (hypertension) 06/13/2011  . Alcohol abuse 06/13/2011  . OSA (obstructive sleep apnea) 06/13/2011    probable    History reviewed. No pertinent past surgical history.  Medications:  HOME MEDS: Prior to Admission medications   Medication Sig Start Date End Date Taking? Authorizing Provider  clonazePAM (KLONOPIN) 0.5 MG tablet Take 0.5 mg by mouth 2 (two) times daily as needed. ANXIETY   Yes Historical Provider, MD  propranolol (INDERAL) 20 MG tablet Take 20 mg by mouth 2 (two) times daily.   Yes Historical Provider, MD    Allergies:  No Known Allergies  Social History:   reports that he has been smoking.  He does not have any smokeless tobacco history on file. He reports that he drinks about 1.8 ounces of alcohol per week. He reports that he does not use illicit drugs.  Family History: No family history on file.  Review of Systems:  The patient denies anorexia, fever, weight loss,, vision loss, decreased hearing, hoarseness, chest pain, syncope, dyspnea on exertion, peripheral edema, balance deficits, hemoptysis, abdominal pain, melena, hematochezia, severe indigestion/heartburn, hematuria, incontinence, genital sores, muscle weakness, suspicious skin lesions, transient blindness, difficulty walking, depression, unusual weight change, abnormal bleeding, enlarged lymph nodes, angioedema, and breast masses.   Physical  Exam:  GEN:  Agitated and Anxious 41 year old Caucasian male examined  and in Mild distress; uncooperative with exam Filed Vitals:   06/13/11 0500 06/13/11 0600 06/13/11 0700 06/13/11 0800  BP: 111/69 114/75 114/69 125/81  Pulse: 80 77 73 80  Temp:      TempSrc:      Resp: 19 20 15 17   Height:      Weight:      SpO2: 94% 94% 93% 95%   Blood pressure 125/81, pulse 80, temperature 98.2 F (36.8 C), temperature source Oral, resp. rate 17, height 5\' 9"  (1.753 m), weight 88.6 kg (195 lb 5.2 oz), SpO2 95.00%. PSYCH: He is alert and oriented x4; does not appear anxious does not appear depressed; affect is normal HEENT: Normocephalic and Atraumatic, Mucous membranes pink; PERRLA; EOM intact; Fundi:  Benign;  No scleral icterus, Nares: Patent, Oropharynx: Clear, Fair Dentition, Neck:  FROM, no cervical lymphadenopathy nor thyromegaly or carotid bruit; no JVD; Breasts:: Not examined CHEST WALL: No tenderness CHEST: Normal respiration, clear to auscultation bilaterally HEART: Regular rate and rhythm; no murmurs rubs or gallops BACK: No kyphosis or scoliosis; no CVA tenderness ABDOMEN: Positive Bowel Sounds, Obese, soft non-tender; no masses, no organomegaly.   Rectal Exam: Not done EXTREMITIES: No bone or joint deformity; age-appropriate arthropathy of the hands and knees; no cyanosis, clubbing or edema; no ulcerations. Genitalia: not examined PULSES: 2+ and symmetric SKIN: Normal hydration no rash or ulceration CNS: Cranial nerves 2-12 grossly intact no focal neurologic deficit   Labs & Imaging Results for orders placed during the hospital  encounter of 06/12/11 (from the past 48 hour(s))  ETHANOL     Status: Normal   Collection Time   06/12/11  3:07 PM      Component Value Range Comment   Alcohol, Ethyl (B) <11  0 - 11 (mg/dL)   POCT I-STAT, CHEM 8     Status: Abnormal   Collection Time   06/12/11  3:15 PM      Component Value Range Comment   Sodium 135  135 - 145 (mEq/L)    Potassium  3.8  3.5 - 5.1 (mEq/L)    Chloride 104  96 - 112 (mEq/L)    BUN 40 (*) 6 - 23 (mg/dL)    Creatinine, Ser 1.61 (*) 0.50 - 1.35 (mg/dL)    Glucose, Bld 80  70 - 99 (mg/dL)    Calcium, Ion 0.96  1.12 - 1.32 (mmol/L)    TCO2 17  0 - 100 (mmol/L)    Hemoglobin 17.3 (*) 13.0 - 17.0 (g/dL)    HCT 04.5  40.9 - 81.1 (%)   URINE RAPID DRUG SCREEN (HOSP PERFORMED)     Status: Normal   Collection Time   06/12/11  3:46 PM      Component Value Range Comment   Opiates NONE DETECTED  NONE DETECTED     Cocaine NONE DETECTED  NONE DETECTED     Benzodiazepines NONE DETECTED  NONE DETECTED     Amphetamines NONE DETECTED  NONE DETECTED     Tetrahydrocannabinol NONE DETECTED  NONE DETECTED     Barbiturates NONE DETECTED  NONE DETECTED    BASIC METABOLIC PANEL     Status: Abnormal   Collection Time   06/13/11  3:35 AM      Component Value Range Comment   Sodium 135  135 - 145 (mEq/L)    Potassium 3.1 (*) 3.5 - 5.1 (mEq/L)    Chloride 97  96 - 112 (mEq/L) REPEATED TO VERIFY   CO2 14 (*) 19 - 32 (mEq/L)    Glucose, Bld 95  70 - 99 (mg/dL)    BUN 32 (*) 6 - 23 (mg/dL)    Creatinine, Ser 9.14  0.50 - 1.35 (mg/dL)    Calcium 9.0  8.4 - 10.5 (mg/dL)    GFR calc non Af Amer >90  >90 (mL/min)    GFR calc Af Amer >90  >90 (mL/min)   CBC     Status: Abnormal   Collection Time   06/13/11  3:35 AM      Component Value Range Comment   WBC 8.3  4.0 - 10.5 (K/uL)    RBC 4.17 (*) 4.22 - 5.81 (MIL/uL)    Hemoglobin 13.5  13.0 - 17.0 (g/dL)    HCT 78.2 (*) 95.6 - 52.0 (%)    MCV 93.0  78.0 - 100.0 (fL)    MCH 32.4  26.0 - 34.0 (pg)    MCHC 34.8  30.0 - 36.0 (g/dL)    RDW 21.3  08.6 - 57.8 (%)    Platelets 66 (*) 150 - 400 (K/uL)   MAGNESIUM     Status: Normal   Collection Time   06/13/11  3:35 AM      Component Value Range Comment   Magnesium 1.6  1.5 - 2.5 (mg/dL)   MRSA PCR SCREENING     Status: Normal   Collection Time   06/13/11  6:33 AM      Component Value Range Comment   MRSA by PCR NEGATIVE  NEGATIVE     Ct Head Wo Contrast  06/12/2011  *RADIOLOGY REPORT*  Clinical Data: 41 year old male with hallucinations and visual disturbance  CT HEAD WITHOUT CONTRAST  Technique:  Contiguous axial images were obtained from the base of the skull through the vertex without contrast.  Comparison: None  Findings: No intracranial abnormalities are identified, including mass lesion or mass effect, hydrocephalus, extra-axial fluid collection, midline shift, hemorrhage, or acute infarction.  The visualized bony calvarium is unremarkable.  IMPRESSION: Unremarkable noncontrast head CT  Original Report Authenticated By: Rosendo Gros, M.D.      Assessment: Present on Admission:  .Alcohol withdrawal delirium .Encephalopathy acute .Hypokalemia .Metabolic acidosis .Tachycardia .Dehydration .Thrombocytopenia .HTN (hypertension) .Anxiety .Alcohol abuse .OSA (obstructive sleep apnea) .ETOH abuse   Plan:  Admitted to Stepdown Bed CIWA Protocol initiated, however patient is unimproved after 6 mg of IV Ativan total had been administered.  PCCM, Critical care was then consulted .   IVFS DVT prophylaxis Reconcile Home Meds. Other plans as per orders.    CODE STATUS:      FULL CODE        Kennet Mccort C 06/13/2011, 9:23 AM

## 2011-06-13 NOTE — Progress Notes (Addendum)
Subjective: Patient currently sleeping. Patient was agitated and combative overnight and now sedated.  Objective: Vital signs in last 24 hours: Filed Vitals:   06/13/11 0500 06/13/11 0600 06/13/11 0700 06/13/11 0800  BP: 111/69 114/75 114/69 125/81  Pulse: 80 77 73 80  Temp:      TempSrc:      Resp: 19 20 15 17   Height:      Weight:      SpO2: 94% 94% 93% 95%    Intake/Output Summary (Last 24 hours) at 06/13/11 0911 Last data filed at 06/13/11 0800  Gross per 24 hour  Intake 1556.33 ml  Output    375 ml  Net 1181.33 ml    Weight change:   General: Sleeping, sedated HEENT: No bruits, no goiter. Heart: Regular rate and rhythm, without murmurs, rubs, gallops. Lungs: Clear to auscultation bilaterally in anterior lung fields. Abdomen: Soft, nontender, nondistended, positive bowel sounds. Extremities: No clubbing cyanosis or edema with positive pedal pulses. Neuro: Grossly intact, nonfocal.    Lab Results:  Basename 06/13/11 0335 06/12/11 1515  NA 135 135  K 3.1* 3.8  CL 97 104  CO2 14* --  GLUCOSE 95 80  BUN 32* 40*  CREATININE 0.95 1.40*  CALCIUM 9.0 --  MG 1.6 --  PHOS -- --   No results found for this basename: AST:2,ALT:2,ALKPHOS:2,BILITOT:2,PROT:2,ALBUMIN:2 in the last 72 hours No results found for this basename: LIPASE:2,AMYLASE:2 in the last 72 hours  Basename 06/13/11 0335 06/12/11 1515  WBC 8.3 --  NEUTROABS -- --  HGB 13.5 17.3*  HCT 38.8* 51.0  MCV 93.0 --  PLT 66* --   No results found for this basename: CKTOTAL:3,CKMB:3,CKMBINDEX:3,TROPONINI:3 in the last 72 hours No components found with this basename: POCBNP:3 No results found for this basename: DDIMER:2 in the last 72 hours No results found for this basename: HGBA1C:2 in the last 72 hours No results found for this basename: CHOL:2,HDL:2,LDLCALC:2,TRIG:2,CHOLHDL:2,LDLDIRECT:2 in the last 72 hours No results found for this basename: TSH,T4TOTAL,FREET3,T3FREE,THYROIDAB in the last 72  hours No results found for this basename: VITAMINB12:2,FOLATE:2,FERRITIN:2,TIBC:2,IRON:2,RETICCTPCT:2 in the last 72 hours  Micro Results: Recent Results (from the past 240 hour(s))  MRSA PCR SCREENING     Status: Normal   Collection Time   06/13/11  6:33 AM      Component Value Range Status Comment   MRSA by PCR NEGATIVE  NEGATIVE  Final     Studies/Results: Ct Head Wo Contrast  06/12/2011  *RADIOLOGY REPORT*  Clinical Data: 41 year old male with hallucinations and visual disturbance  CT HEAD WITHOUT CONTRAST  Technique:  Contiguous axial images were obtained from the base of the skull through the vertex without contrast.  Comparison: None  Findings: No intracranial abnormalities are identified, including mass lesion or mass effect, hydrocephalus, extra-axial fluid collection, midline shift, hemorrhage, or acute infarction.  The visualized bony calvarium is unremarkable.  IMPRESSION: Unremarkable noncontrast head CT  Original Report Authenticated By: Rosendo Gros, M.D.    Medications:    . general admission iv infusion   Intravenous Once  . enoxaparin  40 mg Subcutaneous Q24H  . folic acid  1 mg Intravenous Daily  . haloperidol lactate      . LORazepam  0-4 mg Intravenous Q6H   Followed by  . LORazepam  0-4 mg Intravenous Q12H  . LORazepam  2 mg Intramuscular Once  . LORazepam  1 mg Oral Once  . magnesium sulfate 1 - 4 g bolus IVPB  3 g Intravenous Once  .  nicotine  21 mg Transdermal Once  . pantoprazole (PROTONIX) IV  40 mg Intravenous Q24H  . potassium chloride  10 mEq Intravenous Q1 Hr x 4  . sodium chloride  1,000 mL Intravenous Once  . thiamine  100 mg Oral Daily   Or  . thiamine  100 mg Intravenous Daily  . ziprasidone  10 mg Intramuscular Once  . DISCONTD: folic acid  1 mg Oral Daily  . DISCONTD: folic acid  1 mg Oral Daily  . DISCONTD: LORazepam  0-4 mg Intravenous Q6H  . DISCONTD: LORazepam  0-4 mg Intravenous Q12H  . DISCONTD: LORazepam  1 mg Intravenous Once  .  DISCONTD: LORazepam  2 mg Intravenous Once  . DISCONTD: mulitivitamin with minerals  1 tablet Oral Daily  . DISCONTD: mulitivitamin with minerals  1 tablet Oral Daily  . DISCONTD: potassium chloride  40 mEq Oral Once  . DISCONTD: sodium chloride  500 mL Intravenous Once  . DISCONTD: sodium chloride  500 mL Intravenous Once  . DISCONTD: thiamine  100 mg Intravenous Daily  . DISCONTD: thiamine  100 mg Oral Daily    Assessment: Principal Problem:  *Encephalopathy acute Active Problems:  Alcohol withdrawal delirium  Hypokalemia  Metabolic acidosis  Tachycardia  Dehydration  Thrombocytopenia  HTN (hypertension)  Anxiety  Alcohol abuse   Plan: #1. Acute Encephalopathy Likely seconadary to acute ETOH withdrawal. Patient was very agitated and combative overnight, with no improvement on IV Ativan, now on precedex drip and haldol per PCCM. Continue IV thiamine, folic acid, ivf. Follow. PCCM ff and appreciate input and rxcs.  #2 Acute ETOH Withdrawal/delirium Patient agitated and combative overnight. Now on precedex drip and haldol per PCCM. Continue IV thiamine, folic acid, IVF, supportive care. Per PCCM  #3. Hypokalemia Replete potasium, keep magnesium >2.  #4 Metabolic acidosis Likely secondary to #2. Add HCO to IVF. Follow  #5 Dehydration IVF  #6. Thrombocytopenia Likely secondary to chronic ETOH abuse. No s/sx of bleeding. Check LDH, HAPTOGLOBIN, Hepatic panel, peripheral smear. D/c lovenox. Follow.   #7. HTN Stable.  #8. Anxiety Ativan PRN  #9 Prophylaxis PPI for GI, SCDs for DVT.   LOS: 1 day   Zabrina Brotherton 06/13/2011, 9:11 AM   #10.Prob OSA S/p nasal trumpet per PCCM. Follow. Will likely need outpatient sleep study.  #11. Tachycardia Secondary to ETOH withdrawal. Improved.

## 2011-06-13 NOTE — Progress Notes (Signed)
eLink Physician-Brief Progress Note Patient Name: Warren Aguilar DOB: November 15, 1970 MRN: [id number]  Date of Service  06/13/2011   HPI/Events of Note  Hypokalemia   eICU Interventions  Potassium replaced   Intervention Category Minor Interventions: Electrolytes abnormality - evaluation and management  Jauna Raczynski 06/13/2011, 5:24 AM

## 2011-06-14 ENCOUNTER — Encounter (HOSPITAL_COMMUNITY): Payer: Self-pay | Admitting: Internal Medicine

## 2011-06-14 DIAGNOSIS — G4733 Obstructive sleep apnea (adult) (pediatric): Secondary | ICD-10-CM

## 2011-06-14 DIAGNOSIS — R7401 Elevation of levels of liver transaminase levels: Secondary | ICD-10-CM

## 2011-06-14 DIAGNOSIS — R000 Tachycardia, unspecified: Secondary | ICD-10-CM

## 2011-06-14 HISTORY — DX: Elevation of levels of liver transaminase levels: R74.01

## 2011-06-14 LAB — COMPREHENSIVE METABOLIC PANEL
ALT: 79 U/L — ABNORMAL HIGH (ref 0–53)
AST: 88 U/L — ABNORMAL HIGH (ref 0–37)
Albumin: 3.3 g/dL — ABNORMAL LOW (ref 3.5–5.2)
Alkaline Phosphatase: 43 U/L (ref 39–117)
CO2: 23 mEq/L (ref 19–32)
Chloride: 97 mEq/L (ref 96–112)
GFR calc non Af Amer: >90 mL/min (ref >90)
Potassium: 3.1 mEq/L — ABNORMAL LOW (ref 3.5–5.1)
Sodium: 138 mEq/L (ref 135–145)
Total Bilirubin: 0.8 mg/dL (ref 0.3–1.2)

## 2011-06-14 LAB — CBC
MCH: 31.9 pg (ref 26.0–34.0)
MCHC: 34.2 g/dL (ref 30.0–36.0)
MCV: 93.2 fL (ref 78.0–100.0)
Platelets: 62 10*3/uL — ABNORMAL LOW (ref 150–400)

## 2011-06-14 LAB — MAGNESIUM: Magnesium: 1.9 mg/dL (ref 1.5–2.5)

## 2011-06-14 MED ORDER — POTASSIUM CHLORIDE 10 MEQ/100ML IV SOLN
10.0000 meq | INTRAVENOUS | Status: AC
  Administered 2011-06-14 (×4): 10 meq via INTRAVENOUS
  Filled 2011-06-14 (×4): qty 100

## 2011-06-14 MED ORDER — PANTOPRAZOLE SODIUM 40 MG PO TBEC
40.0000 mg | DELAYED_RELEASE_TABLET | Freq: Every day | ORAL | Status: DC
  Administered 2011-06-14 – 2011-06-16 (×3): 40 mg via ORAL
  Filled 2011-06-14 (×3): qty 1

## 2011-06-14 MED ORDER — SODIUM CHLORIDE 0.9 % IV SOLN
INTRAVENOUS | Status: DC
  Administered 2011-06-14 – 2011-06-15 (×4): via INTRAVENOUS

## 2011-06-14 MED ORDER — SODIUM CHLORIDE 0.9 % IJ SOLN
3.0000 mL | Freq: Two times a day (BID) | INTRAMUSCULAR | Status: DC
  Administered 2011-06-14 – 2011-06-15 (×3): 3 mL via INTRAVENOUS

## 2011-06-14 MED ORDER — MAGNESIUM SULFATE 40 MG/ML IJ SOLN
2.0000 g | Freq: Once | INTRAMUSCULAR | Status: AC
  Administered 2011-06-14: 2 g via INTRAVENOUS
  Filled 2011-06-14: qty 50

## 2011-06-14 MED ORDER — NICOTINE 21 MG/24HR TD PT24
21.0000 mg | MEDICATED_PATCH | Freq: Every day | TRANSDERMAL | Status: DC
  Administered 2011-06-14 – 2011-06-15 (×2): 21 mg via TRANSDERMAL
  Filled 2011-06-14 (×3): qty 1

## 2011-06-14 MED ORDER — POTASSIUM CHLORIDE CRYS ER 20 MEQ PO TBCR
40.0000 meq | EXTENDED_RELEASE_TABLET | Freq: Once | ORAL | Status: AC
  Administered 2011-06-14: 40 meq via ORAL
  Filled 2011-06-14: qty 2

## 2011-06-14 NOTE — Progress Notes (Signed)
eLink Physician-Brief Progress Note Patient Name: Warren Aguilar DOB: 1970/09/17 MRN: [id number]  Date of Service  06/14/2011   HPI/Events of Note   Hypokalemia  eICU Interventions  Potassium replaced   Intervention Category Intermediate Interventions: Electrolyte abnormality - evaluation and management  Milanna Kozlov 06/14/2011, 5:15 AM

## 2011-06-14 NOTE — Progress Notes (Signed)
Subjective: Patient ALERT, following commands. No agitation, no combativeness. Patient denies any hallucinations. Patient asking for food.  Objective: Vital signs in last 24 hours: Filed Vitals:   06/14/11 0500 06/14/11 0600 06/14/11 0700 06/14/11 0800  BP: 141/92  114/70   Pulse: 74 64 61   Temp:    97.5 F (36.4 C)  TempSrc:    Oral  Resp: 13 16 20    Height:      Weight:      SpO2: 99% 100% 100%     Intake/Output Summary (Last 24 hours) at 06/14/11 0906 Last data filed at 06/14/11 0800  Gross per 24 hour  Intake 3249.85 ml  Output   3070 ml  Net 179.85 ml    Weight change: 1.2 kg (2 lb 10.3 oz)  General: Awake, alert, ff commands. HEENT: No bruits, no goiter. Heart: Regular rate and rhythm, without murmurs, rubs, gallops. Lungs: Clear to auscultation bilaterally in anterior lung fields. Abdomen: Soft, nontender, nondistended, positive bowel sounds. Extremities: No clubbing cyanosis or edema with positive pedal pulses. Neuro: Grossly intact, nonfocal.    Lab Results:  [facility] 06/14/11 0343 06/13/11 0335  NA 138 135  K 3.1* 3.1*  CL 97 97  CO2 23 14*  GLUCOSE 78 95  BUN 11 32*  CREATININE 0.72 0.95  CALCIUM 8.2* 9.0  MG 1.9 1.6  PHOS -- --    Basename 06/14/11 0343 06/13/11 0953  AST 88* 68*  ALT 79* 69*  ALKPHOS 43 38*  BILITOT 0.8 0.9  PROT 6.1 6.4  ALBUMIN 3.3* 3.5   No results found for this basename: LIPASE:2,AMYLASE:2 in the last 72 hours  Basename 06/14/11 0343 06/13/11 0335  WBC 6.5 8.3  NEUTROABS -- --  HGB 13.2 13.5  HCT 38.6* 38.8*  MCV 93.2 93.0  PLT 62* 66*   No results found for this basename: CKTOTAL:3,CKMB:3,CKMBINDEX:3,TROPONINI:3 in the last 72 hours No components found with this basename: POCBNP:3 No results found for this basename: DDIMER:2 in the last 72 hours No results found for this basename: HGBA1C:2 in the last 72 hours No results found for this basename: CHOL:2,HDL:2,LDLCALC:2,TRIG:2,CHOLHDL:2,LDLDIRECT:2 in the  last 72 hours No results found for this basename: TSH,T4TOTAL,FREET3,T3FREE,THYROIDAB in the last 72 hours No results found for this basename: VITAMINB12:2,FOLATE:2,FERRITIN:2,TIBC:2,IRON:2,RETICCTPCT:2 in the last 72 hours  Micro Results: Recent Results (from the past 240 hour(s))  MRSA PCR SCREENING     Status: Normal   Collection Time   06/13/11  6:33 AM      Component Value Range Status Comment   MRSA by PCR NEGATIVE  NEGATIVE  Final     Studies/Results: Ct Head Wo Contrast  06/12/2011  *RADIOLOGY REPORT*  Clinical Data: 41 year old male with hallucinations and visual disturbance  CT HEAD WITHOUT CONTRAST  Technique:  Contiguous axial images were obtained from the base of the skull through the vertex without contrast.  Comparison: None  Findings: No intracranial abnormalities are identified, including mass lesion or mass effect, hydrocephalus, extra-axial fluid collection, midline shift, hemorrhage, or acute infarction.  The visualized bony calvarium is unremarkable.  IMPRESSION: Unremarkable noncontrast head CT  Original Report Authenticated By: Rosendo Gros, M.D.    Medications:    . folic acid  1 mg Intravenous Daily  . haloperidol lactate      . LORazepam  0-4 mg Intravenous Q6H   Followed by  . LORazepam  0-4 mg Intravenous Q12H  . magnesium sulfate 1 - 4 g bolus IVPB  3 g Intravenous Once  .  magnesium sulfate 1 - 4 g bolus IVPB  2 g Intravenous Once  . nicotine  21 mg Transdermal Once  . pantoprazole  40 mg Oral Q1200  . potassium chloride  10 mEq Intravenous Q1 Hr x 4  . potassium chloride  10 mEq Intravenous Q1 Hr x 4  . potassium chloride  40 mEq Oral Once  . thiamine  100 mg Oral Daily   Or  . thiamine  100 mg Intravenous Daily  . DISCONTD: enoxaparin  40 mg Subcutaneous Q24H  . DISCONTD: pantoprazole (PROTONIX) IV  40 mg Intravenous Q24H    Assessment: Principal Problem:  *Encephalopathy acute Active Problems:  Alcohol withdrawal delirium  Hypokalemia   Metabolic acidosis  Tachycardia  Dehydration  Thrombocytopenia  HTN (hypertension)  Anxiety  Alcohol abuse  OSA (obstructive sleep apnea)  ETOH abuse  Altered mental status  Acute respiratory failure  Atelectasis  Transaminitis   Plan: #1. Acute Encephalopathy Likely seconadary to acute ETOH withdrawal. Patient not agitated or combative. Clinical  improvement on precedex drip and haldol per PCCM. Precedex drip d/c'd. Continue  CIWA protocol, IV thiamine, folic acid, ivf. Follow. PCCM ff and appreciate input and rxcs.  #2 Acute ETOH Withdrawal/delirium Patient not agitated, not combative. Now off precedex drip.  Continue CIWA protocol, IV thiamine, folic acid, IVF, supportive care. Per PCCM  #3. Hypokalemia Replete potasium, keep magnesium >2.  #4 Metabolic acidosis Likely secondary to #2. Improved on HCO. D/C bicarbonate. Continue IVF. Follow  #5 Dehydration IVF  #6. Thrombocytopenia Likely secondary to chronic ETOH abuse. No s/sx of bleeding. Doubt if hemolysis.   Follow.   #7. Transaminitis Secondary to ETOH abuse. Follow.  #8. HTN Stable.  #8. Anxiety Ativan PRN  #9 Prophylaxis PPI for GI, SCDs for DVT.   #10.Prob OSA S/p nasal trumpet per PCCM. Follow. Will likely need outpatient sleep study.  #11. Tachycardia Secondary to ETOH withdrawal. Resolved.   LOS: 2 days   [facility] 06/14/2011, 9:06 AM

## 2011-06-14 NOTE — Progress Notes (Signed)
Patient name: Warren Aguilar Medical record number: [id number] Date of birth: 23-Dec-1970 Age: 41 y.o. Gender: male PCP: Berenda Morale, MD, MD   Length of Stay =  2     PCCM Service   Reason for Consult: Alcohol withdrawal, severe agitation Referring Physician: Dr. Lovell Sheehan  History of Present Illness: 32yowm with self-reported alcohol abuse who presented to the emergency department 4/12  with chief complaint of hallucinations and agitation. He reports he stopped drinking recently. His primary care had put him on propranolol and Klonopin recently. Prior this he was drinking at least 4 alcoholic beverages a day. He was admitted and started on CIWA protoco> ncreasingly agitated. CCM  are consulted for further evaluation of his agitation and the need for ICU care am 4/13     :   Microbiology/Sepsis markers: Results for orders placed during the hospital encounter of 06/12/11  MRSA PCR SCREENING     Status: Normal   Collection Time   06/13/11  6:33 AM      Component Value Range Status Comment   MRSA by PCR NEGATIVE  NEGATIVE  Final     Anti-infectives:  Anti-infectives    None      Protocols:  Precedex  Consults: Treatment Team:  Md Pccm, MD    Studies: Ct Head Wo Contrast  06/12/2011  *RADIOLOGY REPORT*  Clinical Data: 41 year old male with hallucinations and visual disturbance  CT HEAD WITHOUT CONTRAST  Technique:  Contiguous axial images were obtained from the base of the skull through the vertex without contrast.  Comparison: None  Findings: No intracranial abnormalities are identified, including mass lesion or mass effect, hydrocephalus, extra-axial fluid collection, midline shift, hemorrhage, or acute infarction.  The visualized bony calvarium is unremarkable.  IMPRESSION: Unremarkable noncontrast head CT  Original Report Authenticated By: Rosendo Gros, M.D.     Overnight/ subjective: Much more appropriate, does not remember admit events, minimally tremulous no tachycardia  or fever and precedex tapered off effective 0800    Vital signs for last 24 hours: Temp:  [97.1 F (36.2 C)-98.5 F (36.9 C)] 97.5 F (36.4 C) (04/14 0800) Pulse Rate:  [54-74] 64  (04/14 0600) Resp:  [12-18] 16  (04/14 0600) BP: (100-147)/(64-94) 141/92 mmHg (04/14 0500) SpO2:  [92 %-100 %] 100 % (04/14 0600) Weight:  [197 lb 15.6 oz (89.8 kg)] 197 lb 15.6 oz (89.8 kg) (04/14 0000)  Intake/Output this shift:  Intake/Output Summary (Last 24 hours) at 06/14/11 0837 Last data filed at 06/14/11 0800  Gross per 24 hour  Intake 3305.35 ml  Output   3070 ml  Net 235.35 ml        Physical Exam:  General: Agitated, not alert or interactive. Eyes: Anicteric sclerae. ENT: Oropharynx clear. Moist mucous membranes. No thrush Lymph: No cervical, supraclavicular, or axillary lymphadenopathy. Heart: Normal S1, S2. No murmurs, rubs, or gallops appreciated. No bruits, equal pulses. Lungs: Breath sounds are equal, however definitely obstructs in the supine position.. Abdomen: Abdomen soft, non-tender and not distended, normoactive bowel sounds. No hepatosplenomegaly or masses. Musculoskeletal: No clubbing or synovitis. Skin: No rashes or lesions Neuro: No focal neurologic deficits.   LAB RESULT   Lab 06/14/11 0343 06/13/11 0335 06/12/11 1515  NA 138 135 135  K 3.1* 3.1* 3.8  CL 97 97 104  CO2 23 14* --  BUN 11 32* 40*  CREATININE 0.72 0.95 1.40*  GLUCOSE 78 95 80    Lab 06/14/11 0343 06/13/11 0335 06/12/11 1515  HGB 13.2 13.5 17.3*  HCT  38.6* 38.8* 51.0  WBC 6.5 8.3 --  PLT 62* 66* --       ASSESSMENT AND PLAN  RESPIRATORY No results found for this basename: PHART:5,PCO2:5,PCO2ART:5,PO2ART:5,HCO3:5,O2SAT:5 in the last 168 hours A:  Possible obstructive sleep apnea P: observe for now/ monitor 02 sats ? outpt f/u?   CARDIAC No results found for this basename: TROPONINI:5,PROBNP:5 in the last 168 hours A:  Tachycardia P:   Resolved with rx of  DT's   NEUROLOGIC A:  Alcohol withdrawal, altered mental status P:  Head CT was negative. Recovering nicely with rx of dts - discussed with Dr Janee Morn, leave off precedex and just do the ciwa protocol    LOS: 2 days   Best practices / Disposition: --> [facility] attending/ ccm prn -->Full Code -->Heparin for DVT Px -->Protonix for GI Px   Sandrea Hughs, MD Pulmonary and Critical Care Medicine [facility] Cell [phone]

## 2011-06-15 LAB — COMPREHENSIVE METABOLIC PANEL
Albumin: 3.4 g/dL — ABNORMAL LOW (ref 3.5–5.2)
Alkaline Phosphatase: 43 U/L (ref 39–117)
BUN: 7 mg/dL (ref 6–23)
Calcium: 8.8 mg/dL (ref 8.4–10.5)
GFR calc Af Amer: >90 mL/min (ref >90)
Glucose, Bld: 83 mg/dL (ref 70–99)
Potassium: 3.1 mEq/L — ABNORMAL LOW (ref 3.5–5.1)
Sodium: 137 mEq/L (ref 135–145)
Total Protein: 6.4 g/dL (ref 6.0–8.3)

## 2011-06-15 LAB — CBC
HCT: 37.5 % — ABNORMAL LOW (ref 39.0–52.0)
Hemoglobin: 13 g/dL (ref 13.0–17.0)
MCH: 32.2 pg (ref 26.0–34.0)
MCHC: 34.7 g/dL (ref 30.0–36.0)
RDW: 15 % (ref 11.5–15.5)

## 2011-06-15 LAB — BASIC METABOLIC PANEL
CO2: 25 mEq/L (ref 19–32)
Chloride: 102 mEq/L (ref 96–112)
GFR calc Af Amer: >90 mL/min (ref >90)
Potassium: 3.9 mEq/L (ref 3.5–5.1)

## 2011-06-15 LAB — MAGNESIUM: Magnesium: 2 mg/dL (ref 1.5–2.5)

## 2011-06-15 LAB — PATHOLOGIST SMEAR REVIEW

## 2011-06-15 MED ORDER — FOLIC ACID 1 MG PO TABS
1.0000 mg | ORAL_TABLET | Freq: Every day | ORAL | Status: DC
  Administered 2011-06-15 – 2011-06-16 (×2): 1 mg via ORAL
  Filled 2011-06-15 (×2): qty 1

## 2011-06-15 MED ORDER — POTASSIUM CHLORIDE CRYS ER 20 MEQ PO TBCR
40.0000 meq | EXTENDED_RELEASE_TABLET | ORAL | Status: AC
  Administered 2011-06-15 (×2): 40 meq via ORAL
  Filled 2011-06-15 (×2): qty 2

## 2011-06-15 NOTE — Progress Notes (Signed)
06/14/11-20:00--Received telephone report from [facility] in ICU on pt to be transferred to Rm#[id number]. 06/14/11--20:45-Pt arrived via w/c to rm#[id number].  waith many family members & pt belongings.

## 2011-06-15 NOTE — Progress Notes (Signed)
Subjective: Patient ALERT, following commands. No agitation, no combativeness. Patient denies any hallucinations. Patient feeling better.  Objective: Vital signs in last 24 hours: Filed Vitals:   06/15/11 0114 06/15/11 0410 06/15/11 1039 06/15/11 1356  BP: 144/94 163/94 145/99 142/94  Pulse: 98 101 105 96  Temp: 98.4 F (36.9 C) 98.4 F (36.9 C) 98.6 F (37 C) 98.2 F (36.8 C)  TempSrc: Oral Oral Oral Oral  Resp: 20 20 18 18   Height:      Weight:      SpO2: 98% 98% 96% 98%    Intake/Output Summary (Last 24 hours) at 06/15/11 1647 Last data filed at 06/15/11 1320  Gross per 24 hour  Intake 4319.67 ml  Output   3300 ml  Net 1019.67 ml    Weight change: 2.4 kg (5 lb 4.7 oz)  General: Awake, alert, ff commands. HEENT: No bruits, no goiter. Heart: Regular rate and rhythm, without murmurs, rubs, gallops. Lungs: Clear to auscultation bilaterally in anterior lung fields. Abdomen: Soft, nontender, nondistended, positive bowel sounds. Extremities: No clubbing cyanosis or edema with positive pedal pulses. Neuro: Grossly intact, nonfocal.    Lab Results:  [facility] 06/15/11 0420 06/14/11 0343  NA 137 138  K 3.1* 3.1*  CL 99 97  CO2 27 23  GLUCOSE 83 78  BUN 7 11  CREATININE 0.70 0.72  CALCIUM 8.8 8.2*  MG 2.0 1.9  PHOS -- --    Basename 06/15/11 0420 06/14/11 0343  AST 64* 88*  ALT 66* 79*  ALKPHOS 43 43  BILITOT 0.6 0.8  PROT 6.4 6.1  ALBUMIN 3.4* 3.3*   No results found for this basename: LIPASE:2,AMYLASE:2 in the last 72 hours  Basename 06/15/11 0420 06/14/11 0343  WBC 5.8 6.5  NEUTROABS -- --  HGB 13.0 13.2  HCT 37.5* 38.6*  MCV 92.8 93.2  PLT 79* 62*   No results found for this basename: CKTOTAL:3,CKMB:3,CKMBINDEX:3,TROPONINI:3 in the last 72 hours No components found with this basename: POCBNP:3 No results found for this basename: DDIMER:2 in the last 72 hours No results found for this basename: HGBA1C:2 in the last 72 hours No results found for  this basename: CHOL:2,HDL:2,LDLCALC:2,TRIG:2,CHOLHDL:2,LDLDIRECT:2 in the last 72 hours No results found for this basename: TSH,T4TOTAL,FREET3,T3FREE,THYROIDAB in the last 72 hours No results found for this basename: VITAMINB12:2,FOLATE:2,FERRITIN:2,TIBC:2,IRON:2,RETICCTPCT:2 in the last 72 hours  Micro Results: Recent Results (from the past 240 hour(s))  MRSA PCR SCREENING     Status: Normal   Collection Time   06/13/11  6:33 AM      Component Value Range Status Comment   MRSA by PCR NEGATIVE  NEGATIVE  Final     Studies/Results: No results found.  Medications:    . folic acid  1 mg Intravenous Daily  . LORazepam  0-4 mg Intravenous Q6H   Followed by  . LORazepam  0-4 mg Intravenous Q12H  . nicotine  21 mg Transdermal Daily  . pantoprazole  40 mg Oral Q1200  . potassium chloride  40 mEq Oral Q4H  . sodium chloride  3 mL Intravenous Q12H  . thiamine  100 mg Oral Daily   Or  . thiamine  100 mg Intravenous Daily    Assessment: Principal Problem:  *Encephalopathy acute Active Problems:  Alcohol withdrawal delirium  Hypokalemia  Metabolic acidosis  Tachycardia  Dehydration  Thrombocytopenia  HTN (hypertension)  Anxiety  Alcohol abuse  OSA (obstructive sleep apnea)  ETOH abuse  Altered mental status  Acute respiratory failure  Atelectasis  Transaminitis  Plan: #1. Acute Encephalopathy Likely seconadary to acute ETOH withdrawal. Patient not agitated or combative. Clinical  Improvement. S/P precedex drip and haldol per PCCM. Precedex drip d/c'd. Continue  CIWA protocol, IV thiamine, folic acid, ivf. Follow.   #2 Acute ETOH Withdrawal/delirium Patient not agitated, not combative. Now off precedex drip.  Continue CIWA protocol, IV thiamine, folic acid,  Saline lock IVF, supportive care. Per PCCM  #3. Hypokalemia Replete potasium, keep magnesium >2.  #4 Metabolic acidosis Likely secondary to #2. Resolved. Follow  #5 Dehydration  NSL .IVF  #6.  Thrombocytopenia Likely secondary to chronic ETOH abuse. No s/sx of bleeding. Doubt if hemolysis.   Follow.   #7. Transaminitis Secondary to ETOH abuse. Follow.  #8. HTN Stable.  #8. Anxiety Ativan PRN  #9 Prophylaxis PPI for GI, SCDs for DVT.   #10.Prob OSA S/p nasal trumpet per PCCM. Follow. Will likely need outpatient sleep study.  #11. Tachycardia Secondary to ETOH withdrawal. Resolved.   LOS: 3 days   [facility] 06/15/2011, 4:47 PM

## 2011-06-16 LAB — BASIC METABOLIC PANEL
CO2: 26 mEq/L (ref 19–32)
Chloride: 101 mEq/L (ref 96–112)
Glucose, Bld: 91 mg/dL (ref 70–99)
Potassium: 4.3 mEq/L (ref 3.5–5.1)
Sodium: 137 mEq/L (ref 135–145)

## 2011-06-16 MED ORDER — FOLIC ACID 1 MG PO TABS: 1.0000 mg | ORAL_TABLET | Freq: Every day | ORAL | Status: DC

## 2011-06-16 MED ORDER — THIAMINE HCL 100 MG PO TABS: 100.0000 mg | ORAL_TABLET | Freq: Every day | ORAL | Status: DC

## 2011-06-16 MED ORDER — NICOTINE 21 MG/24HR TD PT24: 1.0000 | MEDICATED_PATCH | Freq: Every day | TRANSDERMAL | Status: AC

## 2011-06-16 MED ORDER — LORAZEPAM 1 MG PO TABS: 1.0000 mg | ORAL_TABLET | Freq: Two times a day (BID) | ORAL | Status: AC | PRN

## 2011-06-16 NOTE — Discharge Summary (Signed)
Discharge Summary  Warren Aguilar MR#: [id number]  DOB:1971-01-19  Date of Admission: 06/12/2011 Date of Discharge: 06/16/2011  Patient's PCP: Berenda Morale, MD, MD  Attending Physician:Shanielle Correll  Consults:   #1 Critical care medicine: Dr. Lavella Hammock  Discharge Diagnoses: Encephalopathy acute Present on Admission:  .Alcohol withdrawal delirium .Encephalopathy acute .Hypokalemia .Metabolic acidosis .Tachycardia .Dehydration .Thrombocytopenia .HTN (hypertension) .Anxiety .Alcohol abuse .OSA (obstructive sleep apnea) .ETOH abuse .Transaminitis   Brief Admitting History and Physical Warren Aguilar is an 41 y.o. male with a history of alcohol abuse who was brought to the ED secondary to visual hallucinations, extreme agitation . His parents report that he has not had a drink in 2 days. The patient reports that he was taking ativan for Withdrawal symptoms the past 2 days. For the rest of admission history and physical please see H&P dictated by Dr. Lovell Sheehan.   Discharge Medications Medication List  As of 06/16/2011 11:23 AM   START taking these medications         folic acid 1 MG tablet   Commonly known as: FOLVITE   Take 1 tablet (1 mg total) by mouth daily.      LORazepam 1 MG tablet   Commonly known as: ATIVAN   Take 1 tablet (1 mg total) by mouth 2 (two) times daily as needed for anxiety.      nicotine 21 mg/24hr patch   Commonly known as: NICODERM CQ - dosed in mg/24 hours   Place 1 patch onto the skin daily.      thiamine 100 MG tablet   Take 1 tablet (100 mg total) by mouth daily.         STOP taking these medications         clonazePAM 0.5 MG tablet      propranolol 20 MG tablet          Where to get your medications    These are the prescriptions that you need to pick up.   You may get these medications from any pharmacy.         folic acid 1 MG tablet   LORazepam 1 MG tablet   nicotine 21 mg/24hr patch   thiamine 100 MG tablet           Hospital Course: Encephalopathy acute/alcohol withdrawal Patient was admitted with alcohol withdrawal hallucinations and agitation as well as combativeness. Due to no significant improvement after 6 mg of IV Ativan a critical care consultation was obtained and patient was admitted to the ICU. Patient was seen in consultation by Dr.Bice and placed on a Precedex drip as well as Haldol taper. Patient was placed on a banana bag and also placed on daily thiamine and folic acid. He was hydrated with IV fluids and followed. Patient's potassium was repleted as well as his magnesium level was kept above 2. Patient improved clinically on the Precedex drip which was subsequently weaned off. Patient's agitation and combativeness improved and did not have any further agitation or combativeness. Patient was subsequently transferred to the telemetry floor where he was maintained on Ativan CIWA protocol. Patient did not have any tremors and no further hallucinations. Social work consultation was obtained who provided patient without patient treatment programs in the list of AA meetings. Patient does not want at this time an inpatient program and resources have been provided to patient which she states is going to followup on. Patient improved clinically such that by day of discharge he was in stable and improved condition. Patient be discharged  home in stable condition with followup with his PCP one week post discharge.  #2 hypokalemia/metabolic acidosis On admission patient was noted to be hypokalemic which was likely secondary to history of alcohol abuse. Patient's potassium was repleted and his magnesium level was also repleted and kept above 2. Bicarbonate was added to patient's IV fluids x1 day with resolution of his metabolic acidosis. Patient's potassium levels and magnesium levels have been adequately repleted by day of discharge. Patient will followup with his PCP as outpatient.  The rest of patient's chronic  medical issues remained stable throughout the hospitalization the patient be discharged in stable and improved condition.  Present on Admission:  .Alcohol withdrawal delirium .Encephalopathy acute .Hypokalemia .Metabolic acidosis .Tachycardia .Dehydration .Thrombocytopenia .HTN (hypertension) .Anxiety .Alcohol abuse .OSA (obstructive sleep apnea) .ETOH abuse .Transaminitis   Day of Discharge BP 119/82  Pulse 108  Temp(Src) 98.1 F (36.7 C) (Oral)  Resp 20  Ht 6\' 2"  (1.88 m)  Wt 92.2 kg (203 lb 4.2 oz)  BMI 26.10 kg/m2  SpO2 97% Subjective: Patient with no agitation or combativeness. No complaints. General: Alert, awake, oriented x3, in no acute distress. HEENT: No bruits, no goiter. Heart: Regular rate and rhythm, without murmurs, rubs, gallops. Lungs: Clear to auscultation bilaterally. Abdomen: Soft, nontender, nondistended, positive bowel sounds. Extremities: No clubbing cyanosis or edema with positive pedal pulses. Neuro: Grossly intact, nonfocal.   Results for orders placed during the hospital encounter of 06/12/11 (from the past 48 hour(s))  CBC     Status: Abnormal   Collection Time   06/15/11  4:20 AM      Component Value Range Comment   WBC 5.8  4.0 - 10.5 (K/uL)    RBC 4.04 (*) 4.22 - 5.81 (MIL/uL)    Hemoglobin 13.0  13.0 - 17.0 (g/dL)    HCT 91.4 (*) 78.2 - 52.0 (%)    MCV 92.8  78.0 - 100.0 (fL)    MCH 32.2  26.0 - 34.0 (pg)    MCHC 34.7  30.0 - 36.0 (g/dL)    RDW 95.6  21.3 - 08.6 (%)    Platelets 79 (*) 150 - 400 (K/uL)   COMPREHENSIVE METABOLIC PANEL     Status: Abnormal   Collection Time   06/15/11  4:20 AM      Component Value Range Comment   Sodium 137  135 - 145 (mEq/L)    Potassium 3.1 (*) 3.5 - 5.1 (mEq/L)    Chloride 99  96 - 112 (mEq/L)    CO2 27  19 - 32 (mEq/L)    Glucose, Bld 83  70 - 99 (mg/dL)    BUN 7  6 - 23 (mg/dL)    Creatinine, Ser 5.78  0.50 - 1.35 (mg/dL)    Calcium 8.8  8.4 - 10.5 (mg/dL)    Total Protein 6.4  6.0 - 8.3  (g/dL)    Albumin 3.4 (*) 3.5 - 5.2 (g/dL)    AST 64 (*) 0 - 37 (U/L)    ALT 66 (*) 0 - 53 (U/L)    Alkaline Phosphatase 43  39 - 117 (U/L)    Total Bilirubin 0.6  0.3 - 1.2 (mg/dL)    GFR calc non Af Amer >90  >90 (mL/min)    GFR calc Af Amer >90  >90 (mL/min)   MAGNESIUM     Status: Normal   Collection Time   06/15/11  4:20 AM      Component Value Range Comment   Magnesium 2.0  1.5 - 2.5 (mg/dL)   BASIC METABOLIC PANEL     Status: Abnormal   Collection Time   06/15/11  4:24 PM      Component Value Range Comment   Sodium 136  135 - 145 (mEq/L)    Potassium 3.9  3.5 - 5.1 (mEq/L)    Chloride 102  96 - 112 (mEq/L)    CO2 25  19 - 32 (mEq/L)    Glucose, Bld 114 (*) 70 - 99 (mg/dL)    BUN 6  6 - 23 (mg/dL)    Creatinine, Ser 1.61  0.50 - 1.35 (mg/dL)    Calcium 8.9  8.4 - 10.5 (mg/dL)    GFR calc non Af Amer >90  >90 (mL/min)    GFR calc Af Amer >90  >90 (mL/min)   BASIC METABOLIC PANEL     Status: Normal   Collection Time   06/16/11  4:35 AM      Component Value Range Comment   Sodium 137  135 - 145 (mEq/L)    Potassium 4.3  3.5 - 5.1 (mEq/L)    Chloride 101  96 - 112 (mEq/L)    CO2 26  19 - 32 (mEq/L)    Glucose, Bld 91  70 - 99 (mg/dL)    BUN 7  6 - 23 (mg/dL)    Creatinine, Ser 0.96  0.50 - 1.35 (mg/dL)    Calcium 9.6  8.4 - 10.5 (mg/dL)    GFR calc non Af Amer >90  >90 (mL/min)    GFR calc Af Amer >90  >90 (mL/min)     Ct Head Wo Contrast  06/12/2011  *RADIOLOGY REPORT*  Clinical Data: 41 year old male with hallucinations and visual disturbance  CT HEAD WITHOUT CONTRAST  Technique:  Contiguous axial images were obtained from the base of the skull through the vertex without contrast.  Comparison: None  Findings: No intracranial abnormalities are identified, including mass lesion or mass effect, hydrocephalus, extra-axial fluid collection, midline shift, hemorrhage, or acute infarction.  The visualized bony calvarium is unremarkable.  IMPRESSION: Unremarkable noncontrast head  CT  Original Report Authenticated By: Rosendo Gros, M.D.     Disposition: Home  Diet: Gen.  Activity: Increase activity slowly   Follow-up Appts: Discharge Orders    Future Orders Please Complete By Expires   Diet general      Increase activity slowly      Discharge instructions      Comments:   Follow up with [facility], MD, in 1-2 weeks       TESTS THAT NEED FOLLOW-UP CMET, Magnesium on follow up with PCP  Time spent on discharge, talking to the patient, and coordinating care: 50 mins.   SignedRamiro Harvest 06/16/2011, 11:23 AM

## 2011-06-16 NOTE — Progress Notes (Signed)
CSW met with patient. Patient is alert and oriented x3. Patient states that he has had an ongoing drinking problem since college. He is now 71 and has been drinking in excess for 18 years. Patient states that he drinks almost daily to excess. He is meeting with his pastor later on today to try and get into an outpatient treatment program. Patient requested list of AA meetings and outpatient treatment programs. Patient does not want inpatient treatment at this time as he works and is concerned as to what an inpatient program would do to his financial status. CSW provided patient with resources and AA meetings. No further CSW needs noted.  Kyen Taite C. Hodari Chuba MSW, LCSW [phone]

## 2011-06-16 NOTE — Progress Notes (Signed)
   CARE MANAGEMENT NOTE 06/16/2011  Patient:  Warren Aguilar,Warren Aguilar   Account Number:  [id number]  Date Initiated:  06/16/2011  Documentation initiated by:  Lanier Clam  Subjective/Objective Assessment:   ADMITTED W/ETOH W/DRAWAL.     Action/Plan:   FROM HOME   Anticipated DC Date:  06/16/2011   Anticipated DC Plan:  HOME/SELF CARE  In-house referral  Clinical Social Worker         Choice offered to / List presented to:             Status of service:  Completed, signed off Medicare Important Message given?   (If response is "NO", the following Medicare IM given date fields will be blank) Date Medicare IM given:   Date Additional Medicare IM given:    Discharge Disposition:  HOME/SELF CARE  Per UR Regulation:  Reviewed for med. necessity/level of care/duration of stay  If discussed at Long Length of Stay Meetings, dates discussed:    Comments:

## 2012-01-21 ENCOUNTER — Encounter (HOSPITAL_COMMUNITY): Payer: Self-pay | Admitting: Emergency Medicine

## 2012-01-21 ENCOUNTER — Emergency Department (HOSPITAL_COMMUNITY)
Admission: EM | Admit: 2012-01-21 | Discharge: 2012-01-21 | Disposition: A | Discharge status: Home / Self Care | Payer: BC Managed Care – PPO | Attending: Emergency Medicine | Admitting: Emergency Medicine

## 2012-01-21 DIAGNOSIS — Y929 Unspecified place or not applicable: Secondary | ICD-10-CM | POA: Insufficient documentation

## 2012-01-21 DIAGNOSIS — G4730 Sleep apnea, unspecified: Secondary | ICD-10-CM | POA: Insufficient documentation

## 2012-01-21 DIAGNOSIS — F10231 Alcohol dependence with withdrawal delirium: Secondary | ICD-10-CM | POA: Insufficient documentation

## 2012-01-21 DIAGNOSIS — W540XXA Bitten by dog, initial encounter: Secondary | ICD-10-CM | POA: Insufficient documentation

## 2012-01-21 DIAGNOSIS — F411 Generalized anxiety disorder: Secondary | ICD-10-CM | POA: Insufficient documentation

## 2012-01-21 DIAGNOSIS — S61409A Unspecified open wound of unspecified hand, initial encounter: Secondary | ICD-10-CM | POA: Insufficient documentation

## 2012-01-21 DIAGNOSIS — F17200 Nicotine dependence, unspecified, uncomplicated: Secondary | ICD-10-CM | POA: Insufficient documentation

## 2012-01-21 DIAGNOSIS — F10931 Alcohol use, unspecified with withdrawal delirium: Secondary | ICD-10-CM | POA: Insufficient documentation

## 2012-01-21 DIAGNOSIS — Y939 Activity, unspecified: Secondary | ICD-10-CM | POA: Insufficient documentation

## 2012-01-21 DIAGNOSIS — Z8679 Personal history of other diseases of the circulatory system: Secondary | ICD-10-CM | POA: Insufficient documentation

## 2012-01-21 DIAGNOSIS — I10 Essential (primary) hypertension: Secondary | ICD-10-CM | POA: Insufficient documentation

## 2012-01-21 DIAGNOSIS — F1010 Alcohol abuse, uncomplicated: Secondary | ICD-10-CM | POA: Insufficient documentation

## 2012-01-21 DIAGNOSIS — S61459A Open bite of unspecified hand, initial encounter: Secondary | ICD-10-CM

## 2012-01-21 MED ORDER — OXYCODONE-ACETAMINOPHEN 5-325 MG PO TABS: 2.0000 | ORAL_TABLET | ORAL | Status: AC | PRN

## 2012-01-21 MED ORDER — AMOXICILLIN-POT CLAVULANATE 250-62.5 MG/5ML PO SUSR: 250.0000 mg | Freq: Three times a day (TID) | ORAL | Status: DC

## 2012-01-21 MED ORDER — IBUPROFEN 800 MG PO TABS
800.0000 mg | ORAL_TABLET | Freq: Once | ORAL | Status: AC
  Administered 2012-01-21: 800 mg via ORAL
  Filled 2012-01-21: qty 1

## 2012-01-21 NOTE — ED Notes (Addendum)
Pt was reaching out to pet his neighbors dog when it bit his hand. Pt with large laceration to back of right hand, Multiple small puncture wounds to back of hand and palm of hand.  Pt states his last tetanus was <5 years ago, unsure of vaccination status of dog.  Pt is attempting to contact neighbor at this time to find out rabies status.

## 2012-01-22 ENCOUNTER — Encounter (HOSPITAL_BASED_OUTPATIENT_CLINIC_OR_DEPARTMENT_OTHER): Payer: Self-pay | Admitting: Anesthesiology

## 2012-01-22 ENCOUNTER — Other Ambulatory Visit: Payer: Self-pay | Admitting: Orthopedic Surgery

## 2012-01-22 ENCOUNTER — Ambulatory Visit (HOSPITAL_BASED_OUTPATIENT_CLINIC_OR_DEPARTMENT_OTHER): Payer: BC Managed Care – PPO | Admitting: Anesthesiology

## 2012-01-22 ENCOUNTER — Encounter (HOSPITAL_BASED_OUTPATIENT_CLINIC_OR_DEPARTMENT_OTHER): Payer: Self-pay | Admitting: *Deleted

## 2012-01-22 ENCOUNTER — Ambulatory Visit (HOSPITAL_BASED_OUTPATIENT_CLINIC_OR_DEPARTMENT_OTHER)
Admission: RE | Admit: 2012-01-22 | Discharge: 2012-01-22 | Disposition: A | Discharge status: Home / Self Care | Payer: BC Managed Care – PPO | Source: Ambulatory Visit | Attending: Orthopedic Surgery | Admitting: Orthopedic Surgery

## 2012-01-22 ENCOUNTER — Encounter (HOSPITAL_BASED_OUTPATIENT_CLINIC_OR_DEPARTMENT_OTHER)
Admission: RE | Disposition: A | Discharge status: Home / Self Care | Payer: Self-pay | Source: Ambulatory Visit | Attending: Orthopedic Surgery

## 2012-01-22 ENCOUNTER — Encounter (HOSPITAL_BASED_OUTPATIENT_CLINIC_OR_DEPARTMENT_OTHER): Payer: Self-pay | Admitting: Orthopedic Surgery

## 2012-01-22 DIAGNOSIS — S61209A Unspecified open wound of unspecified finger without damage to nail, initial encounter: Secondary | ICD-10-CM | POA: Insufficient documentation

## 2012-01-22 DIAGNOSIS — G4733 Obstructive sleep apnea (adult) (pediatric): Secondary | ICD-10-CM | POA: Insufficient documentation

## 2012-01-22 DIAGNOSIS — I10 Essential (primary) hypertension: Secondary | ICD-10-CM | POA: Insufficient documentation

## 2012-01-22 DIAGNOSIS — S66909A Unspecified injury of unspecified muscle, fascia and tendon at wrist and hand level, unspecified hand, initial encounter: Secondary | ICD-10-CM | POA: Insufficient documentation

## 2012-01-22 DIAGNOSIS — F411 Generalized anxiety disorder: Secondary | ICD-10-CM | POA: Insufficient documentation

## 2012-01-22 DIAGNOSIS — W540XXA Bitten by dog, initial encounter: Secondary | ICD-10-CM | POA: Insufficient documentation

## 2012-01-22 DIAGNOSIS — S61409A Unspecified open wound of unspecified hand, initial encounter: Secondary | ICD-10-CM | POA: Insufficient documentation

## 2012-01-22 HISTORY — PX: INCISION AND DRAINAGE OF WOUND: SHX1803

## 2012-01-22 SURGERY — IRRIGATION AND DEBRIDEMENT WOUND
Anesthesia: General | Site: Hand | Laterality: Right | Wound class: Dirty or Infected

## 2012-01-22 MED ORDER — HYDROMORPHONE HCL PF 1 MG/ML IJ SOLN
0.2500 mg | INTRAMUSCULAR | Status: DC | PRN
  Administered 2012-01-22 (×2): 0.5 mg via INTRAVENOUS

## 2012-01-22 MED ORDER — FENTANYL CITRATE 0.05 MG/ML IJ SOLN
INTRAMUSCULAR | Status: DC | PRN
  Administered 2012-01-22 (×2): 100 ug via INTRAVENOUS

## 2012-01-22 MED ORDER — DEXAMETHASONE SODIUM PHOSPHATE 4 MG/ML IJ SOLN
INTRAMUSCULAR | Status: DC | PRN
  Administered 2012-01-22: 10 mg via INTRAVENOUS

## 2012-01-22 MED ORDER — OXYCODONE-ACETAMINOPHEN 5-325 MG PO TABS: ORAL_TABLET | ORAL | Status: AC

## 2012-01-22 MED ORDER — PROPOFOL 10 MG/ML IV BOLUS
INTRAVENOUS | Status: DC | PRN
  Administered 2012-01-22 (×2): 200 mg via INTRAVENOUS

## 2012-01-22 MED ORDER — OXYCODONE HCL 5 MG PO TABS: 5.0000 mg | ORAL_TABLET | Freq: Once | ORAL | Status: DC | PRN

## 2012-01-22 MED ORDER — OXYCODONE HCL 5 MG/5ML PO SOLN: 5.0000 mg | Freq: Once | ORAL | Status: DC | PRN

## 2012-01-22 MED ORDER — SUCCINYLCHOLINE CHLORIDE 20 MG/ML IJ SOLN
INTRAMUSCULAR | Status: DC | PRN
  Administered 2012-01-22: 120 mg via INTRAVENOUS

## 2012-01-22 MED ORDER — AMOXICILLIN-POT CLAVULANATE 250-62.5 MG/5ML PO SUSR: 500.0000 mg | Freq: Two times a day (BID) | ORAL | Status: AC

## 2012-01-22 MED ORDER — GLYCOPYRROLATE 0.2 MG/ML IJ SOLN
INTRAMUSCULAR | Status: DC | PRN
  Administered 2012-01-22: 0.2 mg via INTRAVENOUS

## 2012-01-22 MED ORDER — LACTATED RINGERS IV SOLN
INTRAVENOUS | Status: DC
  Administered 2012-01-22 (×2): via INTRAVENOUS

## 2012-01-22 MED ORDER — MIDAZOLAM HCL 5 MG/5ML IJ SOLN
INTRAMUSCULAR | Status: DC | PRN
  Administered 2012-01-22: 2 mg via INTRAVENOUS

## 2012-01-22 MED ORDER — BUPIVACAINE HCL (PF) 0.25 % IJ SOLN
INTRAMUSCULAR | Status: DC | PRN
  Administered 2012-01-22: 18 mL

## 2012-01-22 MED ORDER — SODIUM CHLORIDE 0.9 % IV SOLN
3.0000 g | INTRAVENOUS | Status: DC | PRN
  Administered 2012-01-22: 3 g via INTRAVENOUS

## 2012-01-22 MED ORDER — ONDANSETRON HCL 4 MG/2ML IJ SOLN
INTRAMUSCULAR | Status: DC | PRN
  Administered 2012-01-22: 4 mg via INTRAVENOUS

## 2012-01-22 MED ORDER — LIDOCAINE HCL (CARDIAC) 20 MG/ML IV SOLN
INTRAVENOUS | Status: DC | PRN
  Administered 2012-01-22: 100 mg via INTRAVENOUS

## 2012-01-22 MED ORDER — ONDANSETRON HCL 4 MG/2ML IJ SOLN: 4.0000 mg | Freq: Once | INTRAMUSCULAR | Status: DC | PRN

## 2012-01-22 SURGICAL SUPPLY — 51 items
BAG DECANTER FOR FLEXI CONT (MISCELLANEOUS) IMPLANT
BANDAGE ELASTIC 3 VELCRO ST LF (GAUZE/BANDAGES/DRESSINGS) ×1 IMPLANT
BANDAGE GAUZE ELAST BULKY 4 IN (GAUZE/BANDAGES/DRESSINGS) ×1 IMPLANT
BANDAGE GAUZE STRT 1 STR LF (GAUZE/BANDAGES/DRESSINGS) IMPLANT
BLADE MINI RND TIP GREEN BEAV (BLADE) IMPLANT
BLADE SURG 15 STRL LF DISP TIS (BLADE) ×2 IMPLANT
BLADE SURG 15 STRL SS (BLADE) ×4
BNDG CMPR 9X4 STRL LF SNTH (GAUZE/BANDAGES/DRESSINGS) ×1
BNDG CMPR MD 5X2 ELC HKLP STRL (GAUZE/BANDAGES/DRESSINGS)
BNDG COHESIVE 1X5 TAN STRL LF (GAUZE/BANDAGES/DRESSINGS) IMPLANT
BNDG ELASTIC 2 VLCR STRL LF (GAUZE/BANDAGES/DRESSINGS) IMPLANT
BNDG ESMARK 4X9 LF (GAUZE/BANDAGES/DRESSINGS) ×1 IMPLANT
CHLORAPREP W/TINT 26ML (MISCELLANEOUS) ×1 IMPLANT
CLOTH BEACON ORANGE TIMEOUT ST (SAFETY) ×2 IMPLANT
CORDS BIPOLAR (ELECTRODE) ×2 IMPLANT
COVER MAYO STAND STRL (DRAPES) ×2 IMPLANT
COVER TABLE BACK 60X90 (DRAPES) ×2 IMPLANT
CUFF TOURNIQUET SINGLE 18IN (TOURNIQUET CUFF) ×2 IMPLANT
DRAPE EXTREMITY T 121X128X90 (DRAPE) ×2 IMPLANT
DRAPE SURG 17X23 STRL (DRAPES) ×2 IMPLANT
GAUZE PACKING IODOFORM 1/4X5 (PACKING) ×1 IMPLANT
GAUZE XEROFORM 1X8 LF (GAUZE/BANDAGES/DRESSINGS) ×2 IMPLANT
GLOVE BIO SURGEON STRL SZ7.5 (GLOVE) ×2 IMPLANT
GLOVE BIOGEL PI IND STRL 8 (GLOVE) ×1 IMPLANT
GLOVE BIOGEL PI INDICATOR 8 (GLOVE) ×1
GLOVE ECLIPSE 6.5 STRL STRAW (GLOVE) ×1 IMPLANT
GOWN PREVENTION PLUS XLARGE (GOWN DISPOSABLE) ×2 IMPLANT
GOWN STRL REIN XL XLG (GOWN DISPOSABLE) ×2 IMPLANT
LOOP VESSEL MAXI BLUE (MISCELLANEOUS) IMPLANT
NDL HYPO 25X1 1.5 SAFETY (NEEDLE) IMPLANT
NEEDLE HYPO 25X1 1.5 SAFETY (NEEDLE) IMPLANT
NS IRRIG 1000ML POUR BTL (IV SOLUTION) ×2 IMPLANT
PACK BASIN DAY SURGERY FS (CUSTOM PROCEDURE TRAY) ×2 IMPLANT
PAD CAST 3X4 CTTN HI CHSV (CAST SUPPLIES) IMPLANT
PADDING CAST ABS 4INX4YD NS (CAST SUPPLIES) ×1
PADDING CAST ABS COTTON 4X4 ST (CAST SUPPLIES) ×1 IMPLANT
PADDING CAST COTTON 3X4 STRL (CAST SUPPLIES) ×2
SPLINT PLASTER CAST XFAST 3X15 (CAST SUPPLIES) IMPLANT
SPLINT PLASTER XTRA FASTSET 3X (CAST SUPPLIES) ×10
SPONGE GAUZE 4X4 12PLY (GAUZE/BANDAGES/DRESSINGS) ×2 IMPLANT
STOCKINETTE 4X48 STRL (DRAPES) ×2 IMPLANT
SUT ETHILON 4 0 PS 2 18 (SUTURE) ×1 IMPLANT
SWAB COLLECTION DEVICE MRSA (MISCELLANEOUS) IMPLANT
SWAB CULTURE LIQ STUART DBL (MISCELLANEOUS) ×1 IMPLANT
SYR BULB 3OZ (MISCELLANEOUS) ×2 IMPLANT
SYR CONTROL 10ML LL (SYRINGE) IMPLANT
TOWEL OR 17X24 6PK STRL BLUE (TOWEL DISPOSABLE) ×4 IMPLANT
TUBE ANAEROBIC SPECIMEN COL (MISCELLANEOUS) ×1 IMPLANT
TUBE FEEDING 5FR 15 INCH (TUBING) IMPLANT
UNDERPAD 30X30 INCONTINENT (UNDERPADS AND DIAPERS) ×2 IMPLANT
WATER STERILE IRR 1000ML POUR (IV SOLUTION) ×1 IMPLANT

## 2012-01-22 NOTE — Anesthesia Preprocedure Evaluation (Addendum)
Anesthesia Evaluation  Patient identified by MRN, date of birth, ID band Patient awake    Reviewed: Allergy & Precautions, H&P , NPO status , Patient's Chart, lab work & pertinent test results  Airway Mallampati: I TM Distance: >3 FB Neck ROM: Full    Dental  (+) Teeth Intact and Dental Advisory Given   Pulmonary  breath sounds clear to auscultation        Cardiovascular hypertension, Pt. on medications Rhythm:Regular Rate:Normal     Neuro/Psych    GI/Hepatic   Endo/Other    Renal/GU      Musculoskeletal   Abdominal   Peds  Hematology   Anesthesia Other Findings It has been suggested that the pt have a sleep study but he has not done so. This was several years ago. He is on no CPAP now and has never been.  Reproductive/Obstetrics                           Anesthesia Physical Anesthesia Plan  ASA: II  Anesthesia Plan: General   Post-op Pain Management:    Induction: Intravenous  Airway Management Planned: Oral ETT  Additional Equipment:   Intra-op Plan:   Post-operative Plan: Extubation in OR  Informed Consent: I have reviewed the patients History and Physical, chart, labs and discussed the procedure including the risks, benefits and alternatives for the proposed anesthesia with the patient or authorized representative who has indicated his/her understanding and acceptance.   Dental advisory given  Plan Discussed with: CRNA, Anesthesiologist and Surgeon  Anesthesia Plan Comments:        Anesthesia Quick Evaluation

## 2012-01-22 NOTE — ED Provider Notes (Signed)
Medical screening examination/treatment/procedure(s) were performed by non-physician practitioner and as supervising physician I was immediately available for consultation/collaboration.  Makala Fetterolf, MD 01/22/12 1524 

## 2012-01-22 NOTE — Op Note (Signed)
Dictation [phone]

## 2012-01-22 NOTE — ED Provider Notes (Signed)
History     CSN: [id number]  Arrival date & time 01/21/12  1348   First MD Initiated Contact with Patient 01/21/12 1356      Chief Complaint  Patient presents with  . Animal Bite    (Consider location/radiation/quality/duration/timing/severity/associated sxs/prior treatment) HPI  PT to the ER after a dog bite. He was bit to his right hand. It was the neighbors pit bull with multiple puncture wounds and a larger gaping laceration to her posterior hand. His had a tetanus within the past 5 years. He said that he went to pet the dog and it just "snapped". He says that he can feel all of his fingers and make a complete fist. The patient is anxious but did not sustain injuries to anywhere else. He is no longer actively bleeding to his hand. He has no other medical concerns at this time. nad vss  Past Medical History  Diagnosis Date  . Anxiety   . Hypertension   . Alcohol withdrawal delirium 06/13/2011  . Thrombocytopenia 06/13/2011  . HTN (hypertension) 06/13/2011  . Alcohol abuse 06/13/2011  . OSA (obstructive sleep apnea) 06/13/2011    probable  . Transaminitis 06/14/2011    History reviewed. No pertinent past surgical history.  History reviewed. No pertinent family history.  History  Substance Use Topics  . Smoking status: Current Every Day Smoker  . Smokeless tobacco: Not on file  . Alcohol Use: 1.8 oz/week    3 Cans of beer per week     Comment: daily      Review of Systems  Review of Systems  Gen: no weight loss, fevers, chills, night sweats  GU: no dysuria or gross hematuria  MSK:  Right hand injury Neuro: no headache, no focal neurologic deficits  Skin: no abnormalities Psyche: negative.   Allergies  Review of patient's allergies indicates no known allergies.  Home Medications   Current Outpatient Rx  Name  Route  Sig  Dispense  Refill  . LANSOPRAZOLE 15 MG PO CPDR   Oral   Take 15 mg by mouth daily.         . ADULT MULTIVITAMIN W/MINERALS CH  Oral   Take 1 tablet by mouth daily.         . AMOXICILLIN-POT CLAVULANATE 250-62.5 MG/5ML PO SUSR   Oral   Take 5 mLs (250 mg total) by mouth 3 (three) times daily.   150 mL   0   . OXYCODONE-ACETAMINOPHEN 5-325 MG PO TABS   Oral   Take 2 tablets by mouth every 4 (four) hours as needed for pain.   6 tablet   0     BP 132/63  Pulse 83  Temp 98 F (36.7 C) (Oral)  Resp 18  SpO2 98%  Physical Exam  Nursing note and vitals reviewed. Constitutional: He appears well-developed and well-nourished. No distress.  HENT:  Head: Normocephalic and atraumatic.  Eyes: Pupils are equal, round, and reactive to light.  Neck: Normal range of motion. Neck supple.  Cardiovascular: Normal rate and regular rhythm.   Pulmonary/Chest: Effort normal.  Musculoskeletal:       Right hand: He exhibits tenderness and laceration. He exhibits normal range of motion, no bony tenderness, normal two-point discrimination, normal capillary refill, no deformity and no swelling. normal sensation noted. Normal strength noted.       Hands: Neurological: He is alert.  Skin: Skin is warm and dry.    ED Course  Procedures (including critical care time)  Labs Reviewed -  No data to display No results found.   1. Animal bite of hand       MDM  Declined Xray. He has full ROM and no boney tenderness.  Pt unable to get a hold of his neighbor while in the ED. He is going to continue to try and if by tomorrow morning he is unable to confirm that the dog has had his rabies vaccinations he will return to hte ED or go to Urgent Care to initiate the rabies series. He understands the the gravity of not doing making sure to have the series if it is needed. I advised him that if he contracts rabies, by the time he shows symptoms it will be too late to save his life.   Wounds soaked in steril saline with betadine for 20 minutes  LACERATION REPAIR Performed by: Dorthula Matas Authorized by: Dorthula Matas Consent: Verbal consent obtained. Risks and benefits: risks, benefits and alternatives were discussed Consent given by: patient Patient identity confirmed: provided demographic data Prepped and Draped in normal sterile fashion Wound explored  Laceration Location: posterior right hand  Laceration Length: 5/5 cm  No Foreign Bodies seen or palpated  Anesthesia: local infiltration  Local anesthetic: lidocaine 2% with epinephrine  Anesthetic total: 3 ml  Irrigation method: syringe Amount of cleaning: standard  Skin closure: sutures, wound edges loosely approximated  Number of sutures: 3  Technique: simple intterupted  Patient tolerance: Patient tolerated the procedure well with no immediate complications.  Pts wound to back of hand is gaping. I can not leave it this way. I told him the risk of infection with animal bites. Also, the increased risk of infection with closing the wound. He understands why i am loosely closing wound. Will start on Augmentin. Pt to return in 48 hours for wound recheck. Told to come sooner if any signs of infections present.  Pt has been advised of the symptoms that warrant their return to the ED. Patient has voiced understanding and has agreed to follow-up with the PCP or specialist.        Dorthula Matas, PA 01/22/12 [phone]

## 2012-01-22 NOTE — Anesthesia Procedure Notes (Signed)
Procedure Name: Intubation Date/Time: 01/22/2012 3:46 PM Performed by: Verlan Friends Pre-anesthesia Checklist: Patient identified, Emergency Drugs available, Suction available, Patient being monitored and Timeout performed Patient Re-evaluated:Patient Re-evaluated prior to inductionOxygen Delivery Method: Circle System Utilized Preoxygenation: Pre-oxygenation with 100% oxygen Intubation Type: IV induction Ventilation: Mask ventilation without difficulty Laryngoscope Size: Miller and 3 Grade View: Grade I Tube type: Oral Tube size: 8.0 mm Number of attempts: 1 Airway Equipment and Method: stylet and oral airway Placement Confirmation: ETT inserted through vocal cords under direct vision,  positive ETCO2 and breath sounds checked- equal and bilateral Secured at: 22 cm Tube secured with: Tape Dental Injury: Teeth and Oropharynx as per pre-operative assessment

## 2012-01-22 NOTE — Anesthesia Postprocedure Evaluation (Signed)
  Anesthesia Post-op Note  Patient: Warren Aguilar  Procedure(s) Performed: Procedure(s) (LRB) with comments: IRRIGATION AND DEBRIDEMENT WOUND (Right) - i and d right hand   Patient Location: PACU  Anesthesia Type:General  Level of Consciousness: awake, alert  and oriented  Airway and Oxygen Therapy: Patient Spontanous Breathing and Patient connected to face mask oxygen  Post-op Pain: mild  Post-op Assessment: Post-op Vital signs reviewed  Post-op Vital Signs: Reviewed  Complications: No apparent anesthesia complications

## 2012-01-22 NOTE — Transfer of Care (Signed)
Immediate Anesthesia Transfer of Care Note  Patient: Warren Aguilar  Procedure(s) Performed: Procedure(s) (LRB) with comments: IRRIGATION AND DEBRIDEMENT WOUND (Right) - i and d right hand   Patient Location: PACU  Anesthesia Type:General  Level of Consciousness: awake, alert , oriented and patient cooperative  Airway & Oxygen Therapy: Patient Spontanous Breathing and Patient connected to face mask oxygen  Post-op Assessment: Report given to PACU RN and Post -op Vital signs reviewed and stable  Post vital signs: Reviewed and stable  Complications: No apparent anesthesia complications

## 2012-01-22 NOTE — H&P (Signed)
  Warren Aguilar is an 41 y.o. male.   Chief Complaint: right hand dog bite HPI: 38 yo rhd male bitten by neighbor's dog yesterday.  Pain and swelling in right hand.  No fevers, chills, night sweats.  No previous injury to hand and no other injury at this time.  Seen at Warren Aguilar where wounds irrigated and sutures place in dorsal wound.    Past Medical History  Diagnosis Date  . Anxiety   . Hypertension   . Alcohol withdrawal delirium 06/13/2011  . Thrombocytopenia 06/13/2011  . HTN (hypertension) 06/13/2011  . Alcohol abuse 06/13/2011  . OSA (obstructive sleep apnea) 06/13/2011    probable  . Transaminitis 06/14/2011    Past Surgical History  Procedure Date  . Eye muscle surgery 1980    History reviewed. No pertinent family history. Social History:  reports that he has been smoking Cigarettes.  He has been smoking about .25 packs per day. He does not have any smokeless tobacco history on file. He reports that he drinks about 1.8 ounces of alcohol per week. He reports that he does not use illicit drugs.  Allergies: No Known Allergies  Medications Prior to Admission  Medication Sig Dispense Refill  . b complex vitamins tablet Take 1 tablet by mouth daily.      Marland Kitchen escitalopram (LEXAPRO) 20 MG tablet Take 20 mg by mouth daily.      Marland Kitchen ibuprofen (ADVIL,MOTRIN) 600 MG tablet Take 600 mg by mouth every 6 (six) hours as needed.      . Multiple Vitamin (MULTIVITAMIN WITH MINERALS) TABS Take 1 tablet by mouth daily.      . niacin 100 MG tablet Take 100 mg by mouth daily with breakfast.      . amoxicillin-clavulanate (AUGMENTIN) 250-62.5 MG/5ML suspension Take 5 mLs (250 mg total) by mouth 3 (three) times daily.  150 mL  0  . oxyCODONE-acetaminophen (PERCOCET/ROXICET) 5-325 MG per tablet Take 2 tablets by mouth every 4 (four) hours as needed for pain.  6 tablet  0    Results for orders placed during the hospital encounter of 01/22/12 (from the past 48 hour(s))  POCT HEMOGLOBIN-HEMACUE     Status:  Normal   Collection Time   01/22/12  3:01 PM      Component Value Range Comment   Hemoglobin 15.1  13.0 - 17.0 g/dL     No results found.   A comprehensive review of systems was negative except for: Respiratory: positive for cough Genitourinary: positive for hematuria Behavioral/Psych: positive for depression  Blood pressure 147/96, pulse 87, temperature 98.3 F (36.8 C), temperature source Oral, resp. rate 18, height 6\' 2"  (1.88 m), weight 93.668 kg (206 lb 8 oz), SpO2 96.00%.  General appearance: alert, cooperative and appears stated age Head: Normocephalic, without obvious abnormality, atraumatic Neck: supple, symmetrical, trachea midline Resp: clear to auscultation bilaterally Cardio: regular rate and rhythm GI: non tender Extremities: light touch sensation intact all digits except long and ring of right hand where sensation is decreased.  +epl/fpl/io.  erythema and wound on dorsum of hand.  erythema and puncture wounds on palm.  no proximal streaking. Pulses: 2+ and symmetric Skin: as above Neurologic: Grossly normal Incision/Wound: As above  Assessment/Plan Right hand dog bite.  Recommend OR for I&D of right hand wounds.  Risks, benefits, and alternatives of surgery were discussed and the patient agrees with the plan of care.   Warren Aguilar R 01/22/2012, 3:31 PM

## 2012-01-24 LAB — WOUND CULTURE

## 2012-01-25 ENCOUNTER — Encounter (HOSPITAL_BASED_OUTPATIENT_CLINIC_OR_DEPARTMENT_OTHER): Payer: Self-pay | Admitting: Orthopedic Surgery

## 2012-01-25 NOTE — Op Note (Signed)
NAME:  Warren Aguilar, Warren Aguilar NO.:  MEDICAL RECORD NO.:  [id number]  LOCATION:                                 FACILITY:  PHYSICIAN:  Betha Loa, MD        DATE OF BIRTH:  [birth year]  DATE OF PROCEDURE:  01/22/2012 DATE OF DISCHARGE:                              OPERATIVE REPORT   PREOPERATIVE DIAGNOSIS:  Dog bite, right hand.  POSTOP DIAGNOSIS:  Dog bite, right hand.  PROCEDURE:  Irrigation and debridement multiple dog bite wounds, right hand.  SURGEON:  Betha Loa, MD.  ASSISTANT:  None.  ANESTHESIA:  General.  IV FLUIDS:  Per anesthesia flow sheet.  ESTIMATED BLOOD LOSS:  Minimal.  COMPLICATIONS:  None.  SPECIMENS:  Cultures to micro  TOURNIQUET TIME: 45 minutes   DISPOSITION: Stable to PACU.  INDICATIONS:  Warren Aguilar is a 41 year old right-hand dominant male who was bitten by his neighbor's pit bull yesterday.  He was seen at the [facility] Emergency Department where his wound was irrigated and debrided.  He followed up in the office today.  He had a large wound on the dorsum of the hand and multiple puncture wounds on the volar aspect of the hand.  He noted decreased sensation in the long and ring fingers. I discussed with this patient the nature of the injury.  I recommended irrigation debridement in the operating room.  He was erythematous. Risks, benefits, and alternatives of surgery were discussed including risk of blood loss, infection, damage to nerves, vessels, tendons, ligaments, bone; failure of surgery; need for additional surgery, complications with wound healing, continued pain, and continued infection need for repeat irrigation and debridement.  He agreed with  The plan of care and wished to proceed.  OPERATIVE COURSE:  After being identified preoperatively by myself, the patient and I agreed upon procedure and site of procedure.  Surgical site was marked.  The risks, benefits, and alternatives of surgery were reviewed and  he wished to proceed.  Surgical consent had been signed. He was transferred up room placed the operating table in supine position with the right upper extremity on arm board.  General anesthesia was induced by the anesthesiologist.  The right upper extremity was prepped and draped in normal sterile orthopedic fashion.  Surgical pause was performed between surgeons, anesthesia, operating staff, and all were in agreement as to the patient, procedure, and site of procedure. Tourniquet at the proximal aspect of the extremities inflated after gravity exsanguination of the hand and Esmarch exsanguination the forearm.  The volar wounds were dressed 1st.  There were 3 wounds in the palm of the hand and 1 over the proximal aspect of the proximal phalanx of the index finger.  Two open wounds in the palm of the hand were connected.  There was a piece of skin within the sheath.  This was removed.  The flexor tendon to the index finger was identified.  The sheath was opened proximal to the A1 pulley.  A small portion A1 pulley was opened as well.  There was no purulence within the tendon sheath. The common digital nerve to the index long finger webspace  was identified and was intact.  The common digital artery was intact as well.  The third wound in the hand was extended as well.  This was between the long and ring finger metacarpals.  There was a piece of skin within this wound as well.  There was some purulence from within first wound and cultures were taken for aerobes and anaerobes.  On the wound between the long and ring fingers, a piece of skin was removed.  The common digital nerve and artery to the long ring finger webspace was identified and both were intact.  Attention was turned to the wound on the index finger.  This was a small wound in was slightly open.  The skin was debrided.  There was no purulence from within.  The wound on the radial side of the thumb was extended.  The flexor tendon  was visualized.  This was over the distal phalanx.  There was no gross purulence.  The thumb was milked and no purulence or fluid came from within the tendon sheath.  There was a wound on the dorsum of the thumb over the nail fold.  This was explored.  The nail was noted to be violated underneath the nail fold.  The nail was removed in the nail bed had been lacerated.  The wound on the dorsum of the hand was then opened.  The sutures from the emergency department had been removed. There was no gross purulence within the wound.  The extensor tendon to the index long and ring fingers were identified and were intact.  The bite coursed down into the index long finger intermetacarpal space were the interosseous muscle was injured.  Again, there was no gross purulence.  The wound was debrided using the rongeur slightly.  All wounds were copiously irrigated with over 2000 mL of sterile saline.  All wounds were packed open with 0.25-inch iodoform gauze.  A single stitch was placed in the combined volar wound to keep the wound edges from retracting, and a single stitch was placed in the third wound on the volar portion of the hand at the keep the neurovascular structures covered.  The wounds were then injected with 18 mL of 0.25% plain Marcaine to aid in postoperative analgesia.  They were then dressed with sterile Xeroform, 4x4s, and wrapped with a Kerlix bandage.  A volar splint was placed wrapped with Kerlix and Ace bandage. The tourniquet was deflated at 44 minutes.  Fingertips were pink with brisk capillary refill after deflation of tourniquet.  He was given 3 g of IV Unasyn.  He was transferred back to stretcher and taken to PACU in stable condition.  I will see him back in the office on Monday for postoperative followup.  I will give him Percocet 5/325 1-2 p.o. q.6 hours p.r.n. pain, dispensed #40 and he will continue on Augmentin 500 mg p.o. b.i.d.     Betha Loa, MD     KK/MEDQ   D:  01/22/2012  T:  01/23/2012  Job:  [id number]

## 2012-02-18 LAB — FUNGUS CULTURE W SMEAR: Fungal Smear: NONE SEEN

## 2013-11-03 IMAGING — CT CT HEAD W/O CM
2 series · 16 of 30 positions shown, 20 images · non-contrast
Comparison: None

CLINICAL DATA: 40-year-old male with hallucinations and visual
disturbance

CT HEAD WITHOUT CONTRAST
TECHNIQUE: Contiguous axial images were obtained from the base of
the skull through the vertex without contrast.

[Series 2: head w/o · axial · non-contrast · 0.43mm/px · z∈[+1481,+1601]mm · 13 of 29 slices shown, 17 images]
[im 3/29  brain]
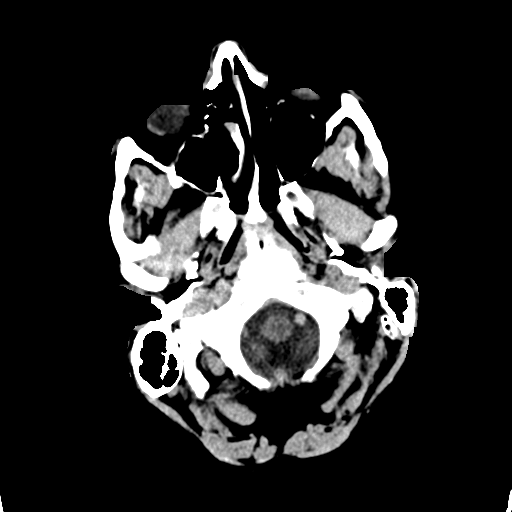
[im 3/29  bone]
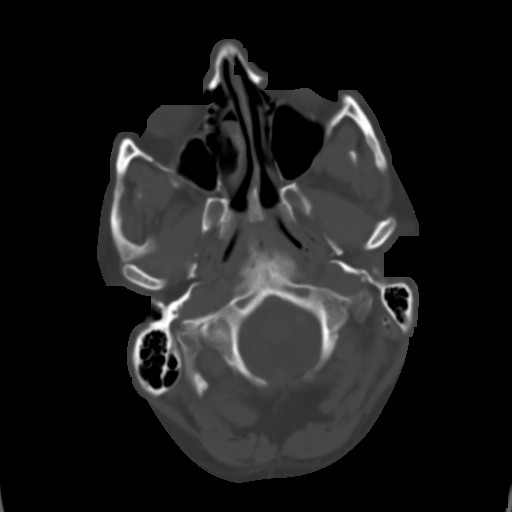
[im 5/29  brain]
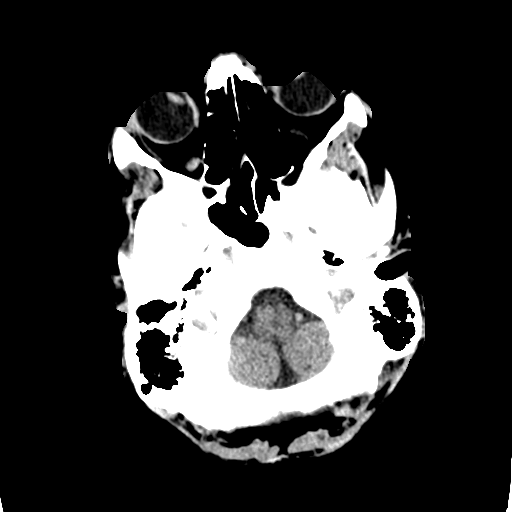
[im 7/29  brain]
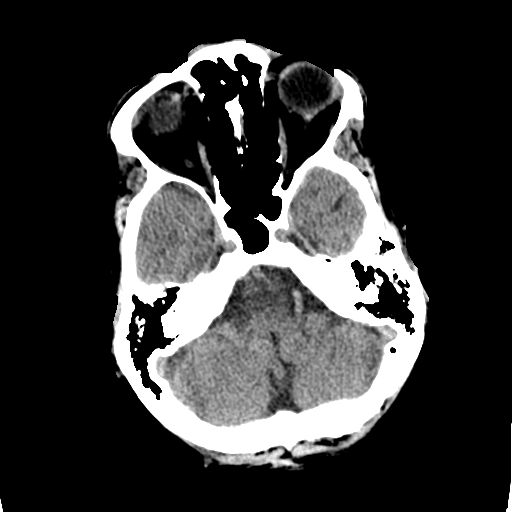
[im 9/29  brain]
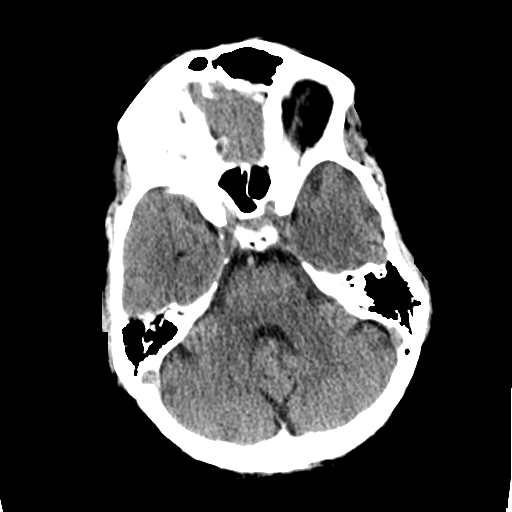
[im 11/29  brain]
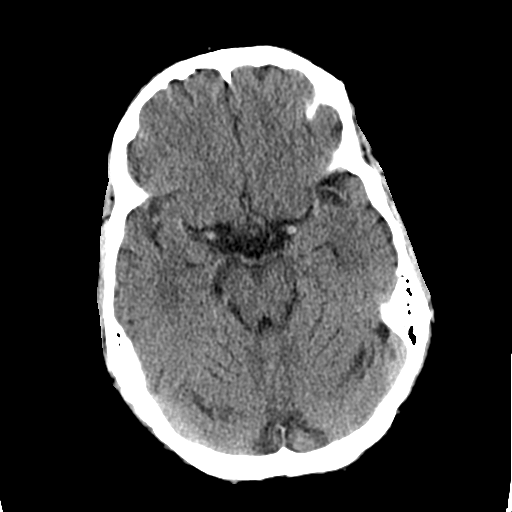
[im 11/29  bone]
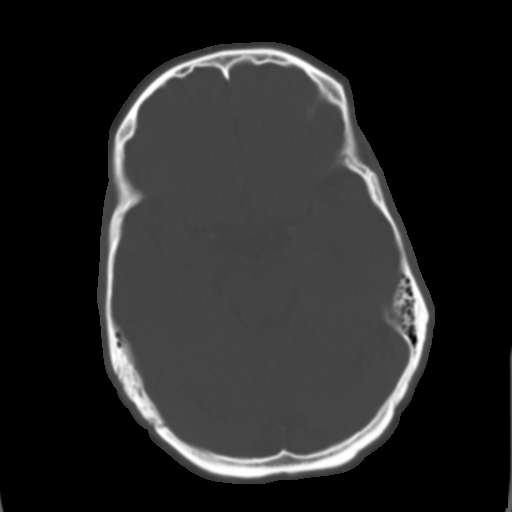
[im 13/29  brain]
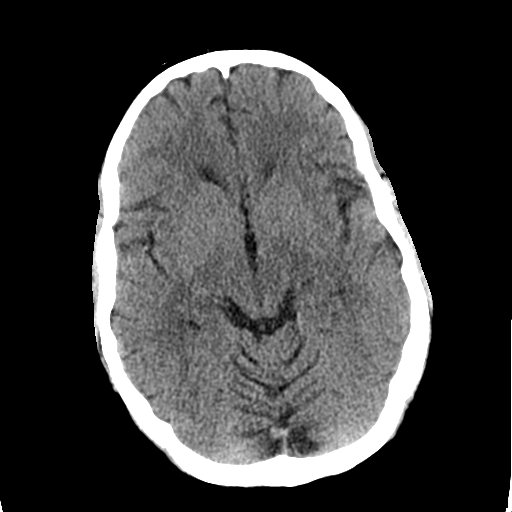
[im 15/29  brain]
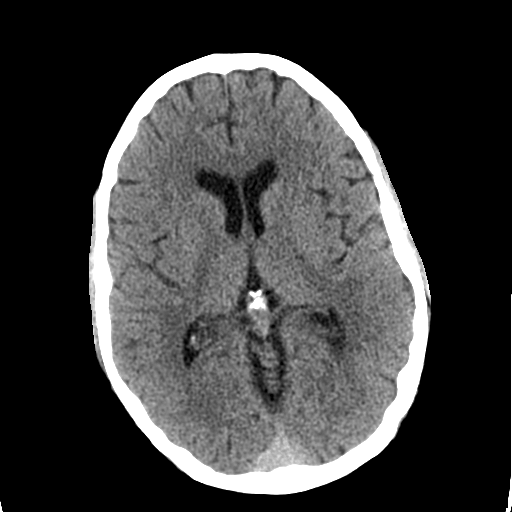
[im 17/29  brain]
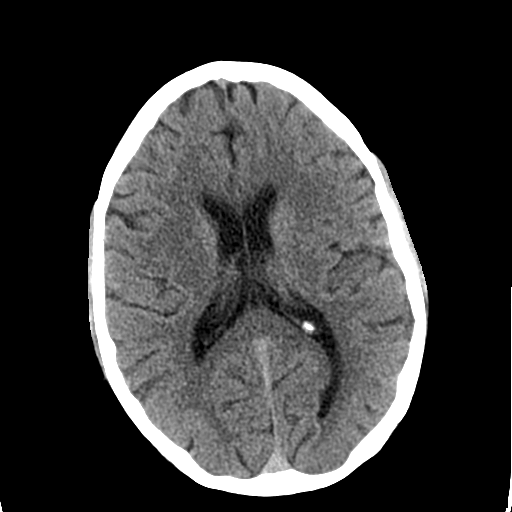
[im 19/29  brain]
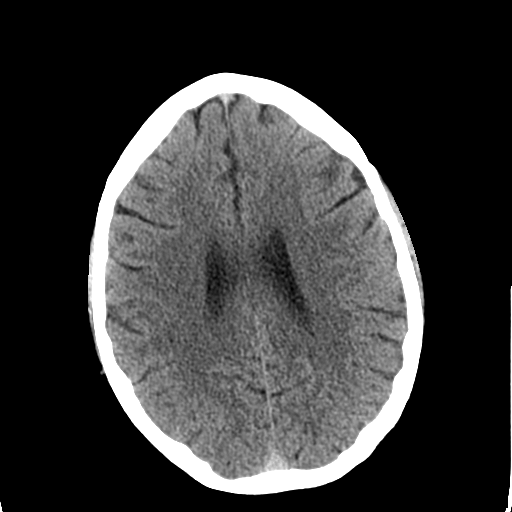
[im 19/29  bone]
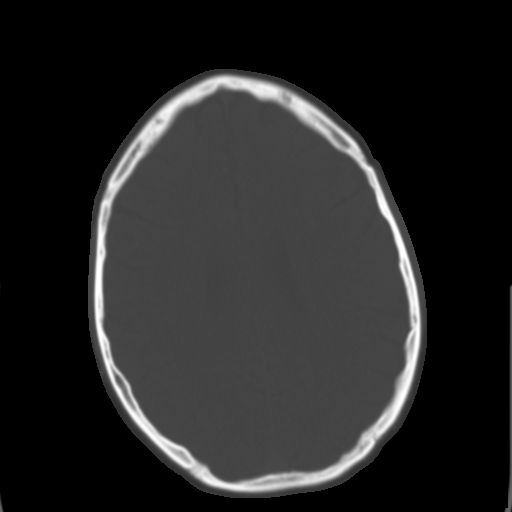
[im 21/29  brain]
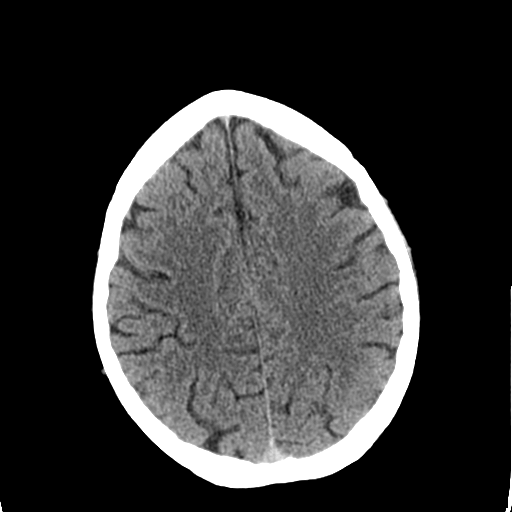
[im 23/29  brain]
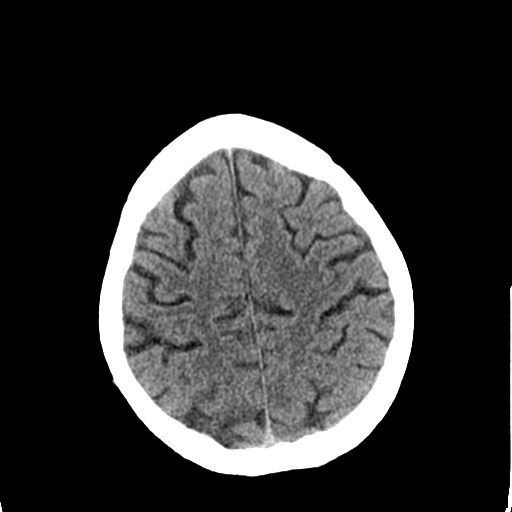
[im 25/29  brain]
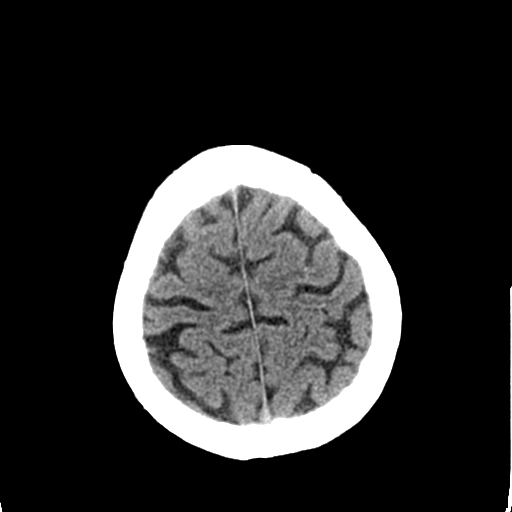
[im 27/29  brain]
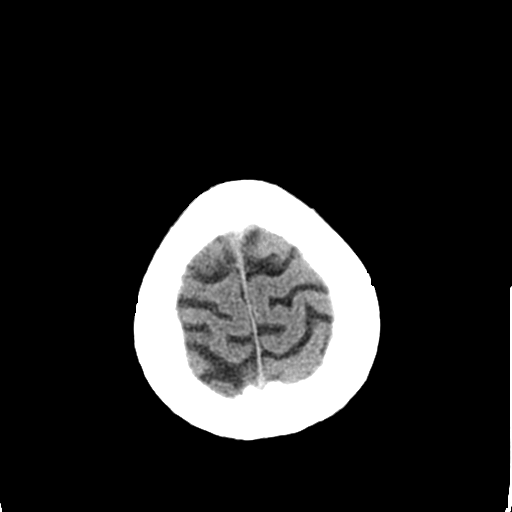
[im 27/29  bone]
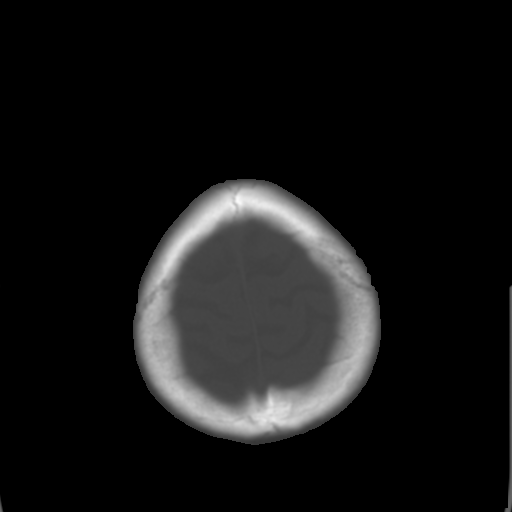

[Series 3: bone windows · axial · 0.43mm/px · z∈[+1481,+1521]mm · 3 of 29 slices shown]
[im 3/29  bone]
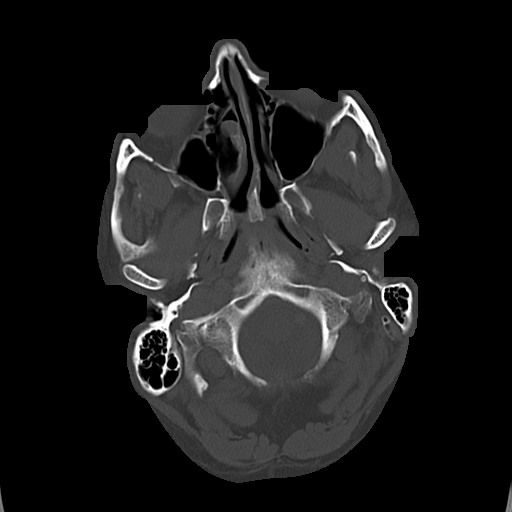
[im 7/29  bone]
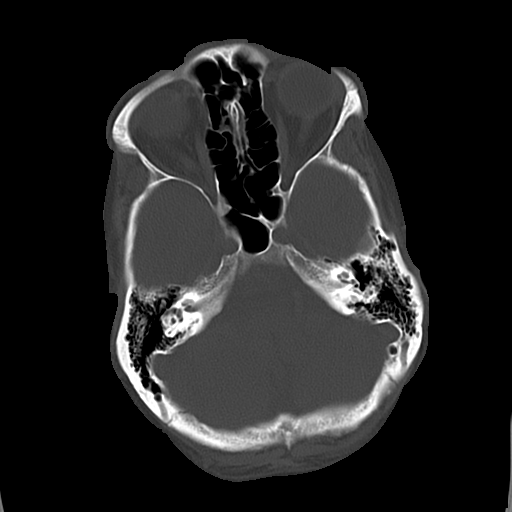
[im 11/29  bone]
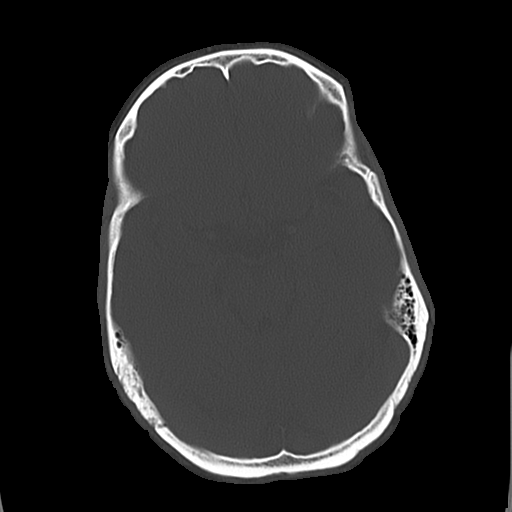

[16 of 30 positions shown; findings below may reference images not displayed]

FINDINGS: No intracranial abnormalities are identified, including
mass lesion or mass effect, hydrocephalus, extra-axial fluid
collection, midline shift, hemorrhage, or acute infarction.

The visualized bony calvarium is unremarkable.
IMPRESSION: Unremarkable noncontrast head CT

## 2022-09-22 ENCOUNTER — Encounter: Payer: Self-pay | Admitting: Dermatology

## 2022-09-22 ENCOUNTER — Ambulatory Visit (INDEPENDENT_AMBULATORY_CARE_PROVIDER_SITE_OTHER): Payer: BLUE CROSS/BLUE SHIELD | Admitting: Dermatology

## 2022-09-22 VITALS — BP 128/82 | HR 62

## 2022-09-22 DIAGNOSIS — Z1283 Encounter for screening for malignant neoplasm of skin: Secondary | ICD-10-CM | POA: Diagnosis not present

## 2022-09-22 DIAGNOSIS — L578 Other skin changes due to chronic exposure to nonionizing radiation: Secondary | ICD-10-CM

## 2022-09-22 DIAGNOSIS — D1801 Hemangioma of skin and subcutaneous tissue: Secondary | ICD-10-CM | POA: Diagnosis not present

## 2022-09-22 DIAGNOSIS — L814 Other melanin hyperpigmentation: Secondary | ICD-10-CM

## 2022-09-22 DIAGNOSIS — Z808 Family history of malignant neoplasm of other organs or systems: Secondary | ICD-10-CM

## 2022-09-22 DIAGNOSIS — L821 Other seborrheic keratosis: Secondary | ICD-10-CM

## 2022-09-22 DIAGNOSIS — W908XXA Exposure to other nonionizing radiation, initial encounter: Secondary | ICD-10-CM

## 2022-09-22 DIAGNOSIS — D229 Melanocytic nevi, unspecified: Secondary | ICD-10-CM

## 2022-09-22 NOTE — Patient Instructions (Addendum)
Hello Mr. Warren Aguilar,  Thank you for visiting our dermatology clinic today. We appreciate your proactive approach to your skin health.  Here is a summary of the key points from today's consultation:  - Skin Examination: We conducted a thorough upper body skin check, starting from your scalp and moving to your face and back. We also checked other areas including under your watch and your lower back.  - Findings:   - Seborrheic Keratosis: Noted on various parts of your body, these are benign and have no malignant potential.   - Solar Lentigines: Observed as brown spots, resulting from sun exposure over your lifetime.   - Cherry Angiomas: These small red spots are benign and are superficial collections of blood vessels.   - Moles: Checked for symmetry, border, and color. All moles observed today are normal. Any change in color or shape should be reported immediately.  - Sun Protection: Continue using sunscreen daily. We discussed your current use of Supergoop Play and recommended applying it to your face, neck, and arms.  - Annual Check-ups: We should schedule annual skin checks to monitor any changes. Your next appointment will be around this time next year.  - Samples Provided: A bag of various sunscreen samples was provided for you to try.  Please continue to monitor your skin and report any new or changing spots. If you have any questions or concerns before your next scheduled visit, do not hesitate to contact our office.  Thank you for your visit today, and keep up the excellent work with your sun protection routine!      Due to recent changes in healthcare laws, you may see results of your pathology and/or laboratory studies on MyChart before the doctors have had a chance to review them. We understand that in some cases there may be results that are confusing or concerning to you. Please understand that not all results are received at the same time and often the doctors may need to  interpret multiple results in order to provide you with the best plan of care or course of treatment. Therefore, we ask that you please give [place] 2 business days to thoroughly review all your results before contacting the office for clarification. Should we see a critical lab result, you will be contacted sooner.   If You Need Anything After Your Visit  If you have any questions or concerns for your doctor, please call our main line at [phone] If no one answers, please leave a voicemail as directed and we will return your call as soon as possible. Messages left after 4 pm will be answered the following business day.   You may also send [place] a message via MyChart. We typically respond to MyChart messages within 1-2 business days.  For prescription refills, please ask your pharmacy to contact our office. Our fax number is [phone].  If you have an urgent issue when the clinic is closed that cannot wait until the next business day, you can page your doctor at the number below.    Please note that while we do our best to be available for urgent issues outside of office hours, we are not available 24/7.   If you have an urgent issue and are unable to reach [place], you may choose to seek medical care at your doctor's office, retail clinic, urgent care center, or emergency room.  If you have a medical emergency, please immediately call 911 or go to the emergency department. In the event of inclement  weather, please call our main line at [phone] for an update on the status of any delays or closures.  Dermatology Medication Tips: Please keep the boxes that topical medications come in in order to help keep track of the instructions about where and how to use these. Pharmacies typically print the medication instructions only on the boxes and not directly on the medication tubes.   If your medication is too expensive, please contact our office at [phone] or send [place] a message through MyChart.    We are unable to tell what your co-pay for medications will be in advance as this is different depending on your insurance coverage. However, we may be able to find a substitute medication at lower cost or fill out paperwork to get insurance to cover a needed medication.   If a prior authorization is required to get your medication covered by your insurance company, please allow [place] 1-2 business days to complete this process.  Drug prices often vary depending on where the prescription is filled and some pharmacies may offer cheaper prices.  The website www.goodrx.com contains coupons for medications through different pharmacies. The prices here do not account for what the cost may be with help from insurance (it may be cheaper with your insurance), but the website can give you the price if you did not use any insurance.  - You can print the associated coupon and take it with your prescription to the pharmacy.  - You may also stop by our office during regular business hours and pick up a GoodRx coupon card.  - If you need your prescription sent electronically to a different pharmacy, notify our office through [facility] or by phone at [phone]

## 2022-09-22 NOTE — Progress Notes (Signed)
   New Patient Visit   Subjective  Warren Aguilar is a 52 y.o. male who presents for the following: upper body check  Patient states he has moles located on the upper that he would like to have examined. Patient reports the areas have been there for years. he reports the areas not bothersome.  Patient reports he has not previously been treated for these areas. Patient does not have  Hx of skin cancer. Patient has family history of skin cancer(s).  Patient uses sunscreen daily.   The following portions of the chart were reviewed this encounter and updated as appropriate: medications, allergies, medical history  Review of Systems:  No other skin or systemic complaints except as noted in HPI or Assessment and Plan.  Objective  Well appearing patient in no apparent distress; mood and affect are within normal limits.   A focused examination was performed of the following areas: Upper body  Relevant exam findings are noted in the Assessment and Plan.  -Waxy stuck on plaques and pink blood vessels on torso - Tan-brown and/or pink-flesh-colored symmetric macules and papules   Assessment & Plan    LENTIGINES SEBORRHEIC KERATOSES,  HEMANGIOMAS - Benign normal skin lesions - Benign-appearing - Call for any changes  MELANOCYTIC NEVI  - Benign appearing on exam today - Observation - Call clinic for new or changing moles - Recommend daily use of broad spectrum spf 30+ sunscreen to sun-exposed areas.   ACTINIC DAMAGE - Chronic condition, secondary to cumulative UV/sun exposure - diffuse scaly erythematous macules with underlying dyspigmentation - Recommend daily broad spectrum sunscreen SPF 30+ to sun-exposed areas, reapply every 2 hours as needed.  - Staying in the shade or wearing long sleeves, sun glasses (UVA+UVB protection) and wide brim hats (4-inch brim around the entire circumference of the hat) are also recommended for sun protection.  - Call for new or changing  lesions.  SKIN CANCER SCREENING PERFORMED TODAY    No follow-ups on file.  Owens Shark, CMA, am acting as scribe for [employer], DO.   Documentation: I have reviewed the above documentation for accuracy and completeness, and I agree with the above.  Langston Reusing, DO

## 2023-09-22 ENCOUNTER — Ambulatory Visit: Payer: Self-pay | Admitting: Dermatology

## 2023-09-23 ENCOUNTER — Ambulatory Visit: Payer: Self-pay | Admitting: Dermatology
